# Patient Record
Sex: Male | Born: 1960 | Hispanic: Yes | Marital: Married | Smoking: Never smoker
Health system: Southern US, Community
[De-identification: ages and names within clinical notes are randomized; demographics above are authoritative.]

## PROBLEM LIST (undated history)

## (undated) HISTORY — PX: JOINT REPLACEMENT: SHX530

---

## 2011-01-24 ENCOUNTER — Emergency Department (HOSPITAL_COMMUNITY): Payer: Self-pay

## 2011-01-24 ENCOUNTER — Emergency Department (HOSPITAL_COMMUNITY)
Admission: EM | Admit: 2011-01-24 | Discharge: 2011-01-25 | Disposition: A | Discharge status: Home / Self Care | Payer: Self-pay | Attending: Emergency Medicine | Admitting: Emergency Medicine

## 2011-01-24 DIAGNOSIS — R109 Unspecified abdominal pain: Secondary | ICD-10-CM | POA: Insufficient documentation

## 2011-01-24 DIAGNOSIS — K7689 Other specified diseases of liver: Secondary | ICD-10-CM | POA: Insufficient documentation

## 2011-01-24 DIAGNOSIS — N201 Calculus of ureter: Secondary | ICD-10-CM | POA: Insufficient documentation

## 2011-01-24 LAB — URINE MICROSCOPIC-ADD ON

## 2011-01-24 LAB — URINALYSIS, ROUTINE W REFLEX MICROSCOPIC
Leukocytes, UA: NEGATIVE
Protein, ur: 30 mg/dL — AB
Specific Gravity, Urine: 1.019 (ref 1.005–1.030)
Urobilinogen, UA: 0.2 mg/dL (ref 0.0–1.0)

## 2014-08-24 ENCOUNTER — Inpatient Hospital Stay (HOSPITAL_COMMUNITY)
Admission: EM | Admit: 2014-08-24 | Discharge: 2014-08-27 | DRG: 603 | Disposition: A | Discharge status: Home / Self Care | Payer: BC Managed Care – PPO | Attending: Internal Medicine | Admitting: Internal Medicine

## 2014-08-24 ENCOUNTER — Ambulatory Visit (INDEPENDENT_AMBULATORY_CARE_PROVIDER_SITE_OTHER): Payer: BC Managed Care – PPO | Admitting: Family Medicine

## 2014-08-24 ENCOUNTER — Encounter (HOSPITAL_COMMUNITY): Payer: Self-pay | Admitting: Emergency Medicine

## 2014-08-24 ENCOUNTER — Emergency Department (HOSPITAL_COMMUNITY): Payer: BC Managed Care – PPO

## 2014-08-24 VITALS — BP 152/80 | HR 90 | Temp 99.0°F | Resp 18 | Ht 65.5 in | Wt 185.0 lb

## 2014-08-24 DIAGNOSIS — L02219 Cutaneous abscess of trunk, unspecified: Secondary | ICD-10-CM

## 2014-08-24 DIAGNOSIS — N498 Inflammatory disorders of other specified male genital organs: Secondary | ICD-10-CM | POA: Diagnosis not present

## 2014-08-24 DIAGNOSIS — L0291 Cutaneous abscess, unspecified: Secondary | ICD-10-CM

## 2014-08-24 DIAGNOSIS — Z23 Encounter for immunization: Secondary | ICD-10-CM

## 2014-08-24 DIAGNOSIS — N433 Hydrocele, unspecified: Secondary | ICD-10-CM | POA: Diagnosis present

## 2014-08-24 DIAGNOSIS — A4902 Methicillin resistant Staphylococcus aureus infection, unspecified site: Secondary | ICD-10-CM | POA: Diagnosis present

## 2014-08-24 DIAGNOSIS — L02214 Cutaneous abscess of groin: Secondary | ICD-10-CM | POA: Diagnosis present

## 2014-08-24 DIAGNOSIS — N492 Inflammatory disorders of scrotum: Secondary | ICD-10-CM | POA: Diagnosis present

## 2014-08-24 DIAGNOSIS — N453 Epididymo-orchitis: Secondary | ICD-10-CM | POA: Diagnosis present

## 2014-08-24 DIAGNOSIS — L03319 Cellulitis of trunk, unspecified: Secondary | ICD-10-CM

## 2014-08-24 DIAGNOSIS — I10 Essential (primary) hypertension: Secondary | ICD-10-CM

## 2014-08-24 DIAGNOSIS — B356 Tinea cruris: Secondary | ICD-10-CM | POA: Diagnosis present

## 2014-08-24 LAB — URINALYSIS, ROUTINE W REFLEX MICROSCOPIC
Glucose, UA: NEGATIVE mg/dL
Hgb urine dipstick: NEGATIVE
Ketones, ur: 40 mg/dL — AB
Leukocytes, UA: NEGATIVE
NITRITE: NEGATIVE
PROTEIN: 30 mg/dL — AB
SPECIFIC GRAVITY, URINE: 1.037 — AB (ref 1.005–1.030)
UROBILINOGEN UA: 4 mg/dL — AB (ref 0.0–1.0)
pH: 6 (ref 5.0–8.0)

## 2014-08-24 LAB — URINE MICROSCOPIC-ADD ON

## 2014-08-24 LAB — CBC
HCT: 40.3 % (ref 39.0–52.0)
HEMOGLOBIN: 13.2 g/dL (ref 13.0–17.0)
MCH: 28.3 pg (ref 26.0–34.0)
MCHC: 32.8 g/dL (ref 30.0–36.0)
MCV: 86.5 fL (ref 78.0–100.0)
Platelets: 196 10*3/uL (ref 150–400)
RBC: 4.66 MIL/uL (ref 4.22–5.81)
RDW: 14.3 % (ref 11.5–15.5)
WBC: 17.5 10*3/uL — AB (ref 4.0–10.5)

## 2014-08-24 LAB — CBG MONITORING, ED: GLUCOSE-CAPILLARY: 89 mg/dL (ref 70–99)

## 2014-08-24 NOTE — ED Provider Notes (Signed)
Complains of swelling of scrotum for approximately one week. He squeezed slight amount pus coming from scrotum earlier this week.. Pain is mild. He reports subjective fever 2 days ago which has resolved. On exam patient is alert nontoxic Glasgow Coma Score 15 abdomen nondistended nontender. Genitalia scrotum was grossly swollen. There is a fluctuant area approximately 3 cm in the posterior aspect of the scrotum. There is also fluctuant area approximately 10 cm x 5 cm at right groin  Doug Sou, MD 08/24/14 2136

## 2014-08-24 NOTE — ED Provider Notes (Signed)
CSN: [id number]     Arrival date & time 08/24/14  1859 History   First MD Initiated Contact with Patient 08/24/14 2045     Chief Complaint  Patient presents with  . Abscess     (Consider location/radiation/quality/duration/timing/severity/associated sxs/prior Treatment) HPI Jose Anthony is a(n) 53 y.o. male who presents to the ED with cc of rash and genital abscess. He was seen at the urgent care earlier today. He has had 1 month of rash of the buttocks and groin which has been self treating with peroxide and Neosporin. Over the past week he has noted swelling in the right groin and under the left testicle. Patient was able to express purulent discharge from the abscess under his left testicle. He subjective fever or yesterday but has not returned. He denies a history of diabetes. He has minimal pain.   History reviewed. No pertinent past medical history. Past Surgical History  Procedure Laterality Date  . Joint replacement     Family History  Problem Relation Age of Onset  . Cancer Mother   . Diabetes Mother   . Diabetes Father   . Diabetes Sister    History  Substance Use Topics  . Smoking status: Never Smoker   . Smokeless tobacco: Not on file  . Alcohol Use: No    Review of Systems  Ten systems reviewed and are negative for acute change, except as noted in the HPI.    Allergies  Review of patient's allergies indicates no known allergies.  Home Medications   Prior to Admission medications   Medication Sig Start Date End Date Taking? Authorizing Provider  acetaminophen (TYLENOL) 325 MG tablet Take 325-650 mg by mouth every 6 (six) hours as needed (pain).   Yes Historical Provider, MD  naproxen sodium (ANAPROX) 220 MG tablet Take 220 mg by mouth 2 (two) times daily as needed (pain).   Yes Historical Provider, MD   BP 163/79  Pulse 87  Temp(Src) 98.9 F (37.2 C) (Oral)  Resp 18  SpO2 99% Physical Exam  Nursing note and vitals reviewed. Constitutional: He  appears well-developed and well-nourished. No distress.  HENT:  Head: Normocephalic and atraumatic.  Eyes: Conjunctivae are normal. No scleral icterus.  Neck: Normal range of motion. Neck supple.  Cardiovascular: Normal rate, regular rhythm and normal heart sounds.   Pulmonary/Chest: Effort normal and breath sounds normal. No respiratory distress.  Abdominal: Soft. There is no tenderness.  Genitourinary: Penis normal.     Well demarcated area around the buttocks and groin with a serpiginous border, satellite lesions. Central clearing and fine scale. Appearance consistent with tinea cruris. There is a 10 cm x 6 cm area of induration with central fluctuants. It is visible area of purulence. The scrotum is indurated and edematous. There is and abscess of about 4 x 2 cm on the left perineum with purulent discharge. Normal genitalia without discharge. No abdominal tenderness.  Musculoskeletal: He exhibits no edema.  Neurological: He is alert.  Skin: Skin is warm and dry. He is not diaphoretic.  Psychiatric: His behavior is normal.    ED Course  Procedures (including critical care time) Labs Review Labs Reviewed  CBC  URINALYSIS, ROUTINE W REFLEX MICROSCOPIC  COMPREHENSIVE METABOLIC PANEL  CBG MONITORING, ED    Imaging Review No results found.   EKG Interpretation None      MDM   Final diagnoses:  Abscess  Essential hypertension    9:58 PM BP 163/79  Pulse 87  Temp(Src) 98.9 F (37.2  C) (Oral)  Resp 18  SpO2 99% Patient with impressive tinea infection and abscess/ He has minimal tenderness on exam I doubt Fournier's.  Patient seen in shared visit with attending physician.  12:27 AM BP 136/72  Pulse 89  Temp(Src) 98.9 F (37.2 C) (Oral)  Resp 18  SpO2 96% Patient labs returned  Urine with urobilinogen. No signs of infection Elevated WBC.I have discussed the findings with patient and he will be admitted by Dr. Elisabeth Pigeon. Dr. Abbey Chatters is aware of the patient  and will consult for surgery.he asks that he remain NPO>      Arthor Captain, PA-C 08/25/14 0045

## 2014-08-24 NOTE — Progress Notes (Signed)
[facility] [address] [place] [address] [phone]  Date:  08/24/2014   Name:  Jose Anthony   DOB:  [birth year]   MRN:  [id number]  PCP:  No PCP Per Patient    Chief Complaint: Rash and genital sores   History of Present Illness:  Jose Anthony is a 53 y.o. very pleasant male patient who presents with the following:  Here today with a rash.  He first noted this on his buttocks about a month ago- he thought it might be ringworm.  He has developed a few more areas on his legs and arms.  The rash is itchy. Over the last week or so he also noted involvement of his genitals.  Was just scaly but now has tender and swollen areas as well.   A few days ago he has testicular swelling, this went down but then returned yesterday.  He has also felt feverish.   He is married He is otherwise generally healthy He does not have a PCP- he is not on medication for his HTN.  He has had HTN for a couple of years and states that he is "supposed tobe on a diet" to treat his BP.    There are no active problems to display for this patient.   History reviewed. No pertinent past medical history.  Past Surgical History  Procedure Laterality Date  . Joint replacement      History  Substance Use Topics  . Smoking status: Never Smoker   . Smokeless tobacco: Not on file  . Alcohol Use: No    Family History  Problem Relation Age of Onset  . Cancer Mother   . Diabetes Mother   . Diabetes Father   . Diabetes Sister     No Known Allergies  Medication list has been reviewed and updated.  No current outpatient prescriptions on file prior to visit.   No current facility-administered medications on file prior to visit.    Review of Systems:  As per HPI- otherwise negative.   Physical Examination: Filed Vitals:   08/24/14 1725  BP: 178/86  Pulse: 90  Temp: 99 F (37.2 C)  Resp: 18   Filed Vitals:   08/24/14 1725  Height: 5' 5.5" (1.664 m)  Weight:  185 lb (83.915 kg)   Body mass index is 30.31 kg/(m^2). Ideal Body Weight: Weight in (lb) to have BMI = 25: 152.2  GEN: WDWN, NAD, Non-toxic, A & O x 3 HEENT: Atraumatic, Normocephalic. Neck supple. No masses, No LAD. Ears and Nose: No external deformity. CV: RRR, No M/G/R. No JVD. No thrill. No extra heart sounds. PULM: CTA B, no wheezes, crackles, rhonchi. No retractions. No resp. distress. No accessory muscle use. ABD: S, NT, NDM. EXTR: No c/c/e NEURO Normal gait.  PSYCH: Normally interactive. Conversant. Not depressed or anxious appearing.  Calm demeanor.  He has what appears to be widespread tinea over his buttock and groin. However he also has firm, indurated, fluctuant areas both in the right inguinal area and posterior to the scrotum.  There is edema of the scrotum and the testes are not easily palpated.    Assessment and Plan: Abscess of groin  Essential hypertension  Referral to ED for abscesses of the groin.  He may need admission for further treatment. He will drive to [place] now.  Asked him to follow-up with [place] in the next few weeks for his BP.    Signed Abbe Amsterdam, MD

## 2014-08-24 NOTE — Patient Instructions (Addendum)
Please proceed to the Emergency room at [facility] or [facility] hospital for further treatment. You can call your wife to meet you there. You may need to have a surgical procedure to drain the pus under your skin, and you may need to be admitted for IV antibiotics   Please come and see [place] in the next few weeks to talk more about your blood pressure

## 2014-08-24 NOTE — Progress Notes (Signed)
  CARE MANAGEMENT ED NOTE 08/24/2014  Patient:  Jose Anthony, Jose Anthony   Account Number:  [id number]  Date Initiated:  08/24/2014  Documentation initiated by:  Radford Pax  Subjective/Objective Assessment:   Patient presents to Ed with rash and genital abcesses     Subjective/Objective Assessment Detail:     Action/Plan:   Action/Plan Detail:   Anticipated DC Date:       Status Recommendation to Physician:   Result of Recommendation:    Other ED Services  Consult Working Plan    DC Planning Services  Other  PCP issues    Choice offered to / List presented to:            Status of service:  Completed, signed off  ED Comments:   ED Comments Detail:  EDCM spoke to patient at bedside.  Patient confrims he has [employer] without a pcp.  EDCM providedpatient with list of pcps who accept BCBS insurance within a ten mile radius of patient's zip code [address].  Discussed importance of finding a pcp.  Patient thankful for resources.  No further EDCM needs at this time.

## 2014-08-24 NOTE — ED Notes (Signed)
Pt reports that he had a rash appear behind his testicle and to his right thigh one month ago. Pt reports that he has areas of fluid collection in these areas now. Pt was seen at urgent care and was referred to the emergency room because the patient was instructed that these "may need to have a surgical procedure to drain the pus under your skin, and you may need to be admitted for IV antibiotics."

## 2014-08-25 ENCOUNTER — Encounter (HOSPITAL_COMMUNITY)
Admission: EM | Disposition: A | Discharge status: Home / Self Care | Payer: Self-pay | Source: Home / Self Care | Attending: Internal Medicine

## 2014-08-25 ENCOUNTER — Inpatient Hospital Stay (HOSPITAL_COMMUNITY): Payer: BC Managed Care – PPO | Admitting: Certified Registered Nurse Anesthetist

## 2014-08-25 ENCOUNTER — Inpatient Hospital Stay (HOSPITAL_COMMUNITY): Payer: BC Managed Care – PPO

## 2014-08-25 ENCOUNTER — Encounter (HOSPITAL_COMMUNITY): Payer: BC Managed Care – PPO | Admitting: Certified Registered Nurse Anesthetist

## 2014-08-25 DIAGNOSIS — A4902 Methicillin resistant Staphylococcus aureus infection, unspecified site: Secondary | ICD-10-CM | POA: Diagnosis present

## 2014-08-25 DIAGNOSIS — N433 Hydrocele, unspecified: Secondary | ICD-10-CM | POA: Diagnosis present

## 2014-08-25 DIAGNOSIS — B356 Tinea cruris: Secondary | ICD-10-CM | POA: Diagnosis present

## 2014-08-25 DIAGNOSIS — I10 Essential (primary) hypertension: Secondary | ICD-10-CM

## 2014-08-25 DIAGNOSIS — N498 Inflammatory disorders of other specified male genital organs: Secondary | ICD-10-CM | POA: Diagnosis present

## 2014-08-25 DIAGNOSIS — N453 Epididymo-orchitis: Secondary | ICD-10-CM | POA: Diagnosis present

## 2014-08-25 DIAGNOSIS — L0291 Cutaneous abscess, unspecified: Secondary | ICD-10-CM | POA: Insufficient documentation

## 2014-08-25 DIAGNOSIS — L02219 Cutaneous abscess of trunk, unspecified: Secondary | ICD-10-CM | POA: Diagnosis present

## 2014-08-25 DIAGNOSIS — L0390 Cellulitis, unspecified: Secondary | ICD-10-CM

## 2014-08-25 DIAGNOSIS — Z23 Encounter for immunization: Secondary | ICD-10-CM | POA: Diagnosis not present

## 2014-08-25 HISTORY — PX: INCISION AND DRAINAGE ABSCESS: SHX5864

## 2014-08-25 LAB — COMPREHENSIVE METABOLIC PANEL
ALBUMIN: 3.8 g/dL (ref 3.5–5.2)
ALBUMIN: 3.2 g/dL — AB (ref 3.5–5.2)
ALK PHOS: 96 U/L (ref 39–117)
ALT: 26 U/L (ref 0–53)
ALT: 29 U/L (ref 0–53)
ANION GAP: 17 — AB (ref 5–15)
AST: 20 U/L (ref 0–37)
AST: 26 U/L (ref 0–37)
Alkaline Phosphatase: 116 U/L (ref 39–117)
Anion gap: 12 (ref 5–15)
BILIRUBIN TOTAL: 0.7 mg/dL (ref 0.3–1.2)
BUN: 13 mg/dL (ref 6–23)
BUN: 11 mg/dL (ref 6–23)
CALCIUM: 9 mg/dL (ref 8.4–10.5)
CHLORIDE: 102 meq/L (ref 96–112)
CO2: 22 mEq/L (ref 19–32)
CO2: 25 mEq/L (ref 19–32)
CREATININE: 0.77 mg/dL (ref 0.50–1.35)
Calcium: 8.4 mg/dL (ref 8.4–10.5)
Chloride: 98 mEq/L (ref 96–112)
Creatinine, Ser: 0.69 mg/dL (ref 0.50–1.35)
GFR calc Af Amer: >90 mL/min (ref >90)
GFR calc Af Amer: >90 mL/min (ref >90)
GFR calc non Af Amer: >90 mL/min (ref >90)
GFR calc non Af Amer: >90 mL/min (ref >90)
Glucose, Bld: 93 mg/dL (ref 70–99)
Glucose, Bld: 135 mg/dL — ABNORMAL HIGH (ref 70–99)
POTASSIUM: 3.8 meq/L (ref 3.7–5.3)
POTASSIUM: 3.8 meq/L (ref 3.7–5.3)
SODIUM: 137 meq/L (ref 137–147)
Sodium: 139 mEq/L (ref 137–147)
TOTAL PROTEIN: 7.6 g/dL (ref 6.0–8.3)
Total Bilirubin: 0.8 mg/dL (ref 0.3–1.2)
Total Protein: 6.8 g/dL (ref 6.0–8.3)

## 2014-08-25 LAB — SURGICAL PCR SCREEN
MRSA, PCR: POSITIVE — AB
STAPHYLOCOCCUS AUREUS: POSITIVE — AB

## 2014-08-25 LAB — CBC
HEMATOCRIT: 37.4 % — AB (ref 39.0–52.0)
Hemoglobin: 12.4 g/dL — ABNORMAL LOW (ref 13.0–17.0)
MCH: 28.3 pg (ref 26.0–34.0)
MCHC: 33.2 g/dL (ref 30.0–36.0)
MCV: 85.4 fL (ref 78.0–100.0)
Platelets: 205 10*3/uL (ref 150–400)
RBC: 4.38 MIL/uL (ref 4.22–5.81)
RDW: 14.4 % (ref 11.5–15.5)
WBC: 14.8 10*3/uL — AB (ref 4.0–10.5)

## 2014-08-25 LAB — GLUCOSE, CAPILLARY: Glucose-Capillary: 132 mg/dL — ABNORMAL HIGH (ref 70–99)

## 2014-08-25 SURGERY — INCISION AND DRAINAGE, ABSCESS
Anesthesia: General | Site: Groin

## 2014-08-25 MED ORDER — ONDANSETRON HCL 4 MG/2ML IJ SOLN
INTRAMUSCULAR | Status: AC
  Filled 2014-08-25: qty 2

## 2014-08-25 MED ORDER — FENTANYL CITRATE 0.05 MG/ML IJ SOLN
INTRAMUSCULAR | Status: AC
  Filled 2014-08-25: qty 5

## 2014-08-25 MED ORDER — IOHEXOL 300 MG/ML  SOLN
100.0000 mL | Freq: Once | INTRAMUSCULAR | Status: AC | PRN
  Administered 2014-08-25: 100 mL via INTRAVENOUS

## 2014-08-25 MED ORDER — SODIUM CHLORIDE 0.9 % IV SOLN
INTRAVENOUS | Status: AC
  Administered 2014-08-25 (×2): via INTRAVENOUS

## 2014-08-25 MED ORDER — OXYCODONE HCL 5 MG PO TABS
5.0000 mg | ORAL_TABLET | ORAL | Status: DC | PRN
  Administered 2014-08-25 – 2014-08-27 (×2): 10 mg via ORAL
  Filled 2014-08-25 (×2): qty 2

## 2014-08-25 MED ORDER — CHLORHEXIDINE GLUCONATE CLOTH 2 % EX PADS
6.0000 | MEDICATED_PAD | Freq: Every day | CUTANEOUS | Status: DC
  Administered 2014-08-26: 6 via TOPICAL

## 2014-08-25 MED ORDER — ONDANSETRON HCL 4 MG PO TABS: 4.0000 mg | ORAL_TABLET | Freq: Four times a day (QID) | ORAL | Status: DC | PRN

## 2014-08-25 MED ORDER — HYDROCODONE-ACETAMINOPHEN 5-325 MG PO TABS: 1.0000 | ORAL_TABLET | ORAL | Status: DC | PRN

## 2014-08-25 MED ORDER — MIDAZOLAM HCL 2 MG/2ML IJ SOLN
INTRAMUSCULAR | Status: AC
  Filled 2014-08-25: qty 2

## 2014-08-25 MED ORDER — ALBUTEROL SULFATE HFA 108 (90 BASE) MCG/ACT IN AERS
INHALATION_SPRAY | RESPIRATORY_TRACT | Status: AC
  Filled 2014-08-25: qty 6.7

## 2014-08-25 MED ORDER — ONDANSETRON HCL 4 MG/2ML IJ SOLN: 4.0000 mg | Freq: Four times a day (QID) | INTRAMUSCULAR | Status: DC | PRN

## 2014-08-25 MED ORDER — 0.9 % SODIUM CHLORIDE (POUR BTL) OPTIME
TOPICAL | Status: DC | PRN
  Administered 2014-08-25: 1000 mL

## 2014-08-25 MED ORDER — ACETAMINOPHEN 325 MG PO TABS: 650.0000 mg | ORAL_TABLET | Freq: Four times a day (QID) | ORAL | Status: DC | PRN

## 2014-08-25 MED ORDER — ONDANSETRON HCL 4 MG/2ML IJ SOLN
INTRAMUSCULAR | Status: DC | PRN
  Administered 2014-08-25: 4 mg via INTRAVENOUS

## 2014-08-25 MED ORDER — PROPOFOL 10 MG/ML IV BOLUS
INTRAVENOUS | Status: AC
  Filled 2014-08-25: qty 20

## 2014-08-25 MED ORDER — ACETAMINOPHEN 650 MG RE SUPP: 650.0000 mg | Freq: Four times a day (QID) | RECTAL | Status: DC | PRN

## 2014-08-25 MED ORDER — MORPHINE SULFATE 2 MG/ML IJ SOLN: 1.0000 mg | INTRAMUSCULAR | Status: DC | PRN

## 2014-08-25 MED ORDER — MUPIROCIN 2 % EX OINT
1.0000 "application " | TOPICAL_OINTMENT | Freq: Two times a day (BID) | CUTANEOUS | Status: DC
  Administered 2014-08-25 – 2014-08-27 (×5): 1 via NASAL
  Filled 2014-08-25: qty 22

## 2014-08-25 MED ORDER — MEPERIDINE HCL 50 MG/ML IJ SOLN: 6.2500 mg | INTRAMUSCULAR | Status: DC | PRN

## 2014-08-25 MED ORDER — FENTANYL CITRATE 0.05 MG/ML IJ SOLN
INTRAMUSCULAR | Status: AC
  Filled 2014-08-25: qty 2

## 2014-08-25 MED ORDER — PIPERACILLIN-TAZOBACTAM 3.375 G IVPB
3.3750 g | Freq: Three times a day (TID) | INTRAVENOUS | Status: DC
  Administered 2014-08-25 – 2014-08-26 (×6): 3.375 g via INTRAVENOUS
  Filled 2014-08-25 (×7): qty 50

## 2014-08-25 MED ORDER — LIDOCAINE HCL (CARDIAC) 20 MG/ML IV SOLN
INTRAVENOUS | Status: DC | PRN
  Administered 2014-08-25: 80 mg via INTRAVENOUS

## 2014-08-25 MED ORDER — CLINDAMYCIN PHOSPHATE 600 MG/50ML IV SOLN
600.0000 mg | Freq: Once | INTRAVENOUS | Status: DC
  Administered 2014-08-25: 600 mg via INTRAVENOUS
  Filled 2014-08-25: qty 50

## 2014-08-25 MED ORDER — ALBUTEROL SULFATE HFA 108 (90 BASE) MCG/ACT IN AERS
INHALATION_SPRAY | RESPIRATORY_TRACT | Status: DC | PRN
  Administered 2014-08-25: 4 via RESPIRATORY_TRACT

## 2014-08-25 MED ORDER — MIDAZOLAM HCL 5 MG/5ML IJ SOLN
INTRAMUSCULAR | Status: DC | PRN
  Administered 2014-08-25: 2 mg via INTRAVENOUS

## 2014-08-25 MED ORDER — SUCCINYLCHOLINE CHLORIDE 20 MG/ML IJ SOLN
INTRAMUSCULAR | Status: DC | PRN
  Administered 2014-08-25: 100 mg via INTRAVENOUS

## 2014-08-25 MED ORDER — LACTATED RINGERS IV SOLN
INTRAVENOUS | Status: DC | PRN
  Administered 2014-08-25: 10:00:00 via INTRAVENOUS

## 2014-08-25 MED ORDER — FENTANYL CITRATE 0.05 MG/ML IJ SOLN
25.0000 ug | INTRAMUSCULAR | Status: DC | PRN
  Administered 2014-08-25 (×2): 50 ug via INTRAVENOUS

## 2014-08-25 MED ORDER — PROPOFOL 10 MG/ML IV BOLUS
INTRAVENOUS | Status: DC | PRN
  Administered 2014-08-25: 90 mg via INTRAVENOUS
  Administered 2014-08-25: 200 mg via INTRAVENOUS

## 2014-08-25 MED ORDER — INFLUENZA VAC SPLIT QUAD 0.5 ML IM SUSY
0.5000 mL | PREFILLED_SYRINGE | INTRAMUSCULAR | Status: AC
  Administered 2014-08-26: 0.5 mL via INTRAMUSCULAR
  Filled 2014-08-25 (×2): qty 0.5

## 2014-08-25 MED ORDER — VANCOMYCIN HCL 10 G IV SOLR
1500.0000 mg | Freq: Two times a day (BID) | INTRAVENOUS | Status: DC
  Administered 2014-08-25 – 2014-08-26 (×3): 1500 mg via INTRAVENOUS
  Filled 2014-08-25 (×4): qty 1500

## 2014-08-25 MED ORDER — MORPHINE SULFATE 2 MG/ML IJ SOLN: 2.0000 mg | INTRAMUSCULAR | Status: DC | PRN

## 2014-08-25 MED ORDER — PIPERACILLIN-TAZOBACTAM 3.375 G IVPB 30 MIN
3.3750 g | INTRAVENOUS | Status: AC
  Administered 2014-08-25: 3.375 g via INTRAVENOUS
  Filled 2014-08-25: qty 50

## 2014-08-25 MED ORDER — FENTANYL CITRATE 0.05 MG/ML IJ SOLN
INTRAMUSCULAR | Status: DC | PRN
  Administered 2014-08-25 (×3): 50 ug via INTRAVENOUS

## 2014-08-25 MED ORDER — LACTATED RINGERS IV SOLN: INTRAVENOUS | Status: DC

## 2014-08-25 MED ORDER — LIDOCAINE HCL (CARDIAC) 20 MG/ML IV SOLN
INTRAVENOUS | Status: AC
  Filled 2014-08-25: qty 5

## 2014-08-25 MED ORDER — PROMETHAZINE HCL 25 MG/ML IJ SOLN: 6.2500 mg | INTRAMUSCULAR | Status: DC | PRN

## 2014-08-25 SURGICAL SUPPLY — 12 items
BNDG CONFORM 2 STRL LF (GAUZE/BANDAGES/DRESSINGS) ×3 IMPLANT
DRAIN PENROSE 18X1/2 LTX STRL (DRAIN) ×3 IMPLANT
GOWN STRL REUS W/ TWL XL LVL3 (GOWN DISPOSABLE) ×3 IMPLANT
GOWN STRL REUS W/TWL XL LVL3 (GOWN DISPOSABLE) ×6
PACK GENERAL/GYN (CUSTOM PROCEDURE TRAY) ×3 IMPLANT
PAD ABD 8X10 STRL (GAUZE/BANDAGES/DRESSINGS) ×3 IMPLANT
SCOTCHCAST PLUS 4X4 WHITE (CAST SUPPLIES) ×3 IMPLANT
SHEET LAVH (DRAPES) ×3 IMPLANT
SWAB COLLECTION DEVICE MRSA (MISCELLANEOUS) ×6 IMPLANT
TOWEL OR 17X26 10 PK STRL BLUE (TOWEL DISPOSABLE) ×3 IMPLANT
TOWEL OR NON WOVEN STRL DISP B (DISPOSABLE) ×3 IMPLANT
TUBE ANAEROBIC SPECIMEN COL (MISCELLANEOUS) ×6 IMPLANT

## 2014-08-25 NOTE — Transfer of Care (Signed)
Immediate Anesthesia Transfer of Care Note  Patient: Jose Anthony  Procedure(s) Performed: Procedure(s) (LRB): INCISION AND DRAINAGE ABSCESS (N/A)  Patient Location: PACU  Anesthesia Type: General  Level of Consciousness: sedated, patient cooperative and responds to stimulation  Airway & Oxygen Therapy: Patient Spontanous Breathing and Patient connected to face mask oxgen  Post-op Assessment: Report given to PACU RN and Post -op Vital signs reviewed and stable  Post vital signs: Reviewed and stable  Complications: No apparent anesthesia complications

## 2014-08-25 NOTE — Anesthesia Preprocedure Evaluation (Addendum)
Anesthesia Evaluation  Patient identified by MRN, date of birth, ID band Patient awake    Reviewed: Allergy & Precautions, H&P , NPO status , Patient's Chart, lab work & pertinent test results  History of Anesthesia Complications Negative for: history of anesthetic complications  Airway Mallampati: III TM Distance: >3 FB Neck ROM: Full    Dental no notable dental hx.    Pulmonary neg pulmonary ROS,  breath sounds clear to auscultation  Pulmonary exam normal       Cardiovascular hypertension, Rhythm:Regular Rate:Normal     Neuro/Psych negative neurological ROS  negative psych ROS   GI/Hepatic negative GI ROS, Neg liver ROS,   Endo/Other  negative endocrine ROS  Renal/GU negative Renal ROS   Scrotal abcess    Musculoskeletal negative musculoskeletal ROS (+)   Abdominal   Peds negative pediatric ROS (+)  Hematology negative hematology ROS (+)   Anesthesia Other Findings   Reproductive/Obstetrics negative OB ROS                          Anesthesia Physical Anesthesia Plan  ASA: II and emergent  Anesthesia Plan: General   Post-op Pain Management:    Induction: Intravenous  Airway Management Planned: LMA and Oral ETT  Additional Equipment:   Intra-op Plan:   Post-operative Plan: Extubation in OR  Informed Consent: I have reviewed the patients History and Physical, chart, labs and discussed the procedure including the risks, benefits and alternatives for the proposed anesthesia with the patient or authorized representative who has indicated his/her understanding and acceptance.   Dental advisory given  Plan Discussed with: CRNA  Anesthesia Plan Comments:         Anesthesia Quick Evaluation

## 2014-08-25 NOTE — ED Provider Notes (Signed)
Medical screening examination/treatment/procedure(s) were conducted as a shared visit with non-physician practitioner(s) and myself.  I personally evaluated the patient during the encounter.   EKG Interpretation None       Doug Sou, MD 08/25/14 1610

## 2014-08-25 NOTE — Progress Notes (Signed)
ANTIBIOTIC CONSULT NOTE - INITIAL  Pharmacy Consult for Vancomycin & Zosyn Indication: Wound Infection/Cellulitis  No Known Allergies  Patient Measurements: Height: 5' 5.5" (166.4 cm) Weight: 185 lb (83.915 kg) IBW/kg (Calculated) : 62.65  Vital Signs: Temp: 98.9 F (37.2 C) (09/25 2351) Temp src: Oral (09/25 2351) BP: 136/72 mmHg (09/25 2351) Pulse Rate: 89 (09/25 2351) Intake/Output from previous day:   Intake/Output from this shift:    Labs:  Recent Labs  08/24/14 2132 08/24/14 2133  WBC 17.5*  --   HGB 13.2  --   PLT 196  --   CREATININE  --  0.69   Estimated Creatinine Clearance: 107.5 ml/min (by C-G formula based on Cr of 0.69). No results found for this basename: VANCOTROUGH, VANCOPEAK, VANCORANDOM, GENTTROUGH, GENTPEAK, GENTRANDOM, TOBRATROUGH, TOBRAPEAK, TOBRARND, AMIKACINPEAK, AMIKACINTROU, AMIKACIN,  in the last 72 hours   Microbiology: No results found for this or any previous visit (from the past 720 hour(s)).  Medical History: History reviewed. No pertinent past medical history.  Medications:  Scheduled:   Infusions:  . piperacillin-tazobactam     Assessment:  53 yr male with complaint of scrotum swelling with abscess  Clindamycin  IV x 1 given in ED @ 00:19  Surgery consulted  Pharmacy asked to dose Vancomycin and Zosyn for wound infection/cellulitis  Goal of Therapy:  Vancomycin trough level 10-15 mcg/ml  Plan:  Measure antibiotic drug levels at steady state Follow up culture results  Zosyn 3.375gm IV q8h (each dose infused over 4 hrs)  Vancomycin  IV q12h  Kiondra Caicedo, Joselyn Glassman, PharmD 08/25/2014,12:47 AM

## 2014-08-25 NOTE — Progress Notes (Signed)
[facility] PROGRESS NOTE  Jose Anthony ONG:[id number] DOB: Jan 13, 1961 DOA: 08/24/2014 PCP: No PCP Per Patient  Assessment/Plan: 1. Growing and scrotal abscess -Patient presenting with complaints of increasing pain, swelling and purulent discharge since last Tuesday. -Initial evaluation included a CT scan which showed extensive scrotal edema and skin thickening with infiltration in the scrotal and right groin subcutaneous fat consistent with cellulitis. Small collections demonstrated in the right medial thigh and left posterior inferior scrotal skin consistent with small abscesses. -General surgery and urology consulted, patient taken to the OR on 08/25/2014 where he underwent incision and drainage. -Followup on cultures -Meanwhile will continue empiric IV antibiotic therapy with Zosyn and Vancomycin  Code Status: Full code Family Communication: Discussed case with patient's wife who was present at bedside Disposition Plan: Continue broad-spectrum IV antibiotic therapy, supportive care, followup on cultures   Consultants:  General surgery  Urology  Procedures:  Incision and drainage of right groin and scrotal abscess  Antibiotics:  Vancomycin IV started on 08/25/2014  Zosyn IV started on 08/25/2014  HPI/Subjective: Patient is a pleasant 53 year old with no significant past medical history who was admitted to the medicine service on 08/25/2014. Patient presented with worsening erythema, pain and swelling involving the right groin area. Stated this started last Tuesday as a boil. He noticed purulent drainage as well as the development of fevers and chills one day prior to his hospitalization. In the emergency room he was found to have abscess in the groin and scrotum, general surgery and urology were consulted. He was taken to the OR on 08/25/2014 undergoing incision and drainage.  Objective: Filed Vitals:   08/25/14 1400  BP: 125/67  Pulse: 79  Temp: 97.8 F (36.6 C)   Resp: 16    Intake/Output Summary (Last 24 hours) at 08/25/14 1557 Last data filed at 08/25/14 1122  Gross per 24 hour  Intake 1306.25 ml  Output      0 ml  Net 1306.25 ml   Filed Weights   08/25/14 0045 08/25/14 0104 08/25/14 0552  Weight: 83.915 kg (185 lb) 84.777 kg (186 lb 14.4 oz) 84.7 kg (186 lb 11.7 oz)    Exam:   General:  Patient states doing well, he is awake alert oriented, nontoxic appearing  Cardiovascular: Regular rate and rhythm normal S1-S2  Respiratory: Overall clear to auscultation bilaterally, normal respiratory effort  Abdomen: Soft nontender nondistended  Musculoskeletal: Status post incision and drainage to right growing and scrotal abscess, surgical incision sites appear clean there is no evidence of purulent drainage or active bleeding  Data Reviewed: Basic Metabolic Panel:  Recent Labs Lab 08/24/14 2133 08/25/14 0536  NA 137 139  K 3.8 3.8  CL 98 102  CO2 22 25  GLUCOSE 93 135*  BUN 13 11  CREATININE 0.69 0.77  CALCIUM 9.0 8.4   Liver Function Tests:  Recent Labs Lab 08/24/14 2133 08/25/14 0536  AST 20 26  ALT 26 29  ALKPHOS 96 116  BILITOT 0.8 0.7  PROT 7.6 6.8  ALBUMIN 3.8 3.2*   No results found for this basename: LIPASE, AMYLASE,  in the last 168 hours No results found for this basename: AMMONIA,  in the last 168 hours CBC:  Recent Labs Lab 08/24/14 2132 08/25/14 0536  WBC 17.5* 14.8*  HGB 13.2 12.4*  HCT 40.3 37.4*  MCV 86.5 85.4  PLT 196 205   Cardiac Enzymes: No results found for this basename: CKTOTAL, CKMB, CKMBINDEX, TROPONINI,  in the last 168 hours BNP (last 3  results) No results found for this basename: PROBNP,  in the last 8760 hours CBG:  Recent Labs Lab 08/24/14 2112 08/25/14 0732  GLUCAP 89 132*    Recent Results (from the past 240 hour(s))  SURGICAL PCR SCREEN     Status: Abnormal   Collection Time    08/25/14  9:04 AM      Result Value Ref Range Status   MRSA, PCR POSITIVE (*)  NEGATIVE Final   Comment: RESULT CALLED TO, READ BACK BY AND VERIFIED WITH:     C.RUSSELL AT 1238 ON [id number] BY C.BONGEL   Staphylococcus aureus POSITIVE (*) NEGATIVE Final   Comment:            The Xpert SA Assay (FDA     approved for NASAL specimens     in patients over 60 years of age),     is one component of     a comprehensive surveillance     program.  Test performance has     been validated by [employer] for patients greater     than or equal to 98 year old.     It is not intended     to diagnose infection nor to     guide or monitor treatment.     Studies: US Scrotum  08/24/2014   CLINICAL DATA:  Pain, swelling, rash  EXAM: SCROTAL ULTRASOUND  DOPPLER ULTRASOUND OF THE TESTICLES  TECHNIQUE: Complete ultrasound examination of the testicles, epididymis, and other scrotal structures was performed. Color and spectral Doppler ultrasound were also utilized to evaluate blood flow to the testicles.  COMPARISON:  None.  FINDINGS: Right testicle  Measurements: 5.1 x 3 x 3.1 cm. No mass or microlithiasis visualized.  Left testicle  Measurements: 4.2 x 2.6 x 3.3 cm. No mass or microlithiasis visualized.  Right epididymis:  Normal in size.  Small spermatocele.  Left epididymis: Enlarged left epididymis with a heterogeneous appearance and increased Doppler flow relative to the right. 2.1 cm left spermatocele.  Hydrocele:  Bilateral hydroceles, right greater than left.  Varicocele:  None visualized.  Pulsed Doppler interrogation of both testes demonstrates low resistance arterial and venous waveforms bilaterally.  Other: Scrotal wall thickening. Soft tissue edema involving the perineum.  IMPRESSION: 1. Enlarged heterogeneous left epididymis with increased Doppler flow concerning for epididymitis. 2. Scrotal wall thickening and soft tissue edema extending into the perineum. 3. Bilateral hydroceles, right greater than left.   Electronically Signed   By: Elige Ko   On: 08/24/2014 23:22   Ct  Abdomen Pelvis W Contrast  08/25/2014   CLINICAL DATA:  Genital and right thigh pain and swelling. Rash. Evaluate for abscess.  EXAM: CT ABDOMEN AND PELVIS WITH CONTRAST  TECHNIQUE: Multidetector CT imaging of the abdomen and pelvis was performed using the standard protocol following bolus administration of intravenous contrast.  CONTRAST:  OMNIPAQUE IOHEXOL 300 MG/ML  SOLN  COMPARISON:  Ultrasound testes 08/24/2014. CT abdomen and pelvis 01/24/2011.  FINDINGS: Lung bases are clear.  The liver, spleen, gallbladder, pancreas, adrenal glands, abdominal aorta, inferior vena cava, and retroperitoneal lymph nodes are unremarkable. Small low-attenuation lesion in the lower pole right kidney measuring 2.3 cm diameter consistent with cyst. No hydronephrosis in either kidney. Stomach and small bowel are not abnormally distended. Stool in the colon without distention or wall thickening. No free air or free fluid in the abdomen.  Pelvis: Appendix is normal. Prostate gland is not enlarged. Bladder wall is not thickened.  No free or loculated pelvic fluid collections. No pelvic lymphadenopathy. Left inguinal hernia containing fat. Moderately prominent groin lymph nodes bilaterally are likely reactive. There is evidence of edema and skin thickening of the scrotum and right groin region consistent with cellulitis. There is a small soft tissue fluid collection in the right medial upper thigh measuring about 9 x 20 mm consistent with a small abscess. Suggestion of small loculated collection in the posterior inferior aspect of the scrotum towards the left and measuring 1.7 cm. Visualization is limited due to significant scrotal edema. Small hydroceles. Degenerative changes in the lumbar spine. No destructive bone lesions appreciated.  IMPRESSION: Extensive scrotal edema and skin thickening with infiltration in the scrotal and right groin subcutaneous fat consistent with cellulitis. Small collections are demonstrated in the  right medial thigh and left posterior inferior scrotal skin consistent with small abscesses.   Electronically Signed   By: Burman Nieves M.D.   On: 08/25/2014 03:33   [place] Art/ven Flow Abd Pelv Doppler  08/24/2014   CLINICAL DATA:  Pain, swelling, rash  EXAM: SCROTAL ULTRASOUND  DOPPLER ULTRASOUND OF THE TESTICLES  TECHNIQUE: Complete ultrasound examination of the testicles, epididymis, and other scrotal structures was performed. Color and spectral Doppler ultrasound were also utilized to evaluate blood flow to the testicles.  COMPARISON:  None.  FINDINGS: Right testicle  Measurements: 5.1 x 3 x 3.1 cm. No mass or microlithiasis visualized.  Left testicle  Measurements: 4.2 x 2.6 x 3.3 cm. No mass or microlithiasis visualized.  Right epididymis:  Normal in size.  Small spermatocele.  Left epididymis: Enlarged left epididymis with a heterogeneous appearance and increased Doppler flow relative to the right. 2.1 cm left spermatocele.  Hydrocele:  Bilateral hydroceles, right greater than left.  Varicocele:  None visualized.  Pulsed Doppler interrogation of both testes demonstrates low resistance arterial and venous waveforms bilaterally.  Other: Scrotal wall thickening. Soft tissue edema involving the perineum.  IMPRESSION: 1. Enlarged heterogeneous left epididymis with increased Doppler flow concerning for epididymitis. 2. Scrotal wall thickening and soft tissue edema extending into the perineum. 3. Bilateral hydroceles, right greater than left.   Electronically Signed   By: Elige Ko   On: 08/24/2014 23:22    Scheduled Meds: . Melene Muller ON 08/26/2014] Chlorhexidine Gluconate Cloth  6 each Topical Q0600  . fentaNYL      . [START ON 08/26/2014] Influenza vac split quadrivalent PF  0.5 mL Intramuscular Tomorrow-1000  . mupirocin ointment  1 application Nasal BID  . piperacillin-tazobactam (ZOSYN)  IV  3.375 g Intravenous Q8H  . vancomycin  1,500 mg Intravenous Q12H   Continuous Infusions:   Principal  Problem:   Abscess    Time spent:    Jeralyn Bennett  [facility] Pager [phone]. If 7PM-7AM, please contact night-coverage at [email or web address], password [facility] 08/25/2014, 3:57 PM  LOS: 1 day

## 2014-08-25 NOTE — Consult Note (Signed)
Reason for Consult:  Right groin abscess Referring Physician:  Dr. Manson Passey  Jose Anthony is an 53 y.o. male.  HPI: He had the onset of a "rash" in the right groin and left scrotal area 4-5 days ago.  He began scratching the areas and then noted them to become red, swollen and painful.  The also began draining.  He presented to the ED last night.   CT demonstrated a 2 cm fluid collection in the right groin with surrounding inflammatory changes and thickening of the left scrotal area.  US of the scrotum demonstrated a enlarged epididymis on the left along with scrotal swelling.  He was admitted by Dr. Elisabeth Pigeon and placed on IV abxs.  I was asked to see him because of the right groin abscess.  History reviewed. No pertinent past medical history.  Past Surgical History  Procedure Laterality Date  . Joint replacement      Family History  Problem Relation Age of Onset  . Cancer Mother   . Diabetes Mother   . Diabetes Father   . Diabetes Sister     Social History:  reports that he has never smoked. He does not have any smokeless tobacco history on file. He reports that he does not drink alcohol or use illicit drugs.  Allergies: No Known Allergies  Current facility-administered medications:0.9 %  sodium chloride infusion, , Intravenous, Continuous, Alison Murray, MD, Last Rate: 75 mL/hr at 08/25/14 0135;  acetaminophen (TYLENOL) suppository 650 mg, 650 mg, Rectal, Q6H PRN, Alison Murray, MD;  acetaminophen (TYLENOL) tablet 650 mg, 650 mg, Oral, Q6H PRN, Alison Murray, MD;  HYDROcodone-acetaminophen (NORCO/VICODIN) 5-325 MG per tablet 1-2 tablet, 1-2 tablet, Oral, Q4H PRN, Alison Murray, MD [START ON 08/26/2014] Influenza vac split quadrivalent PF (FLUARIX) injection 0.5 mL, 0.5 mL, Intramuscular, Tomorrow-1000, Alison Murray, MD;  morphine 2 MG/ML injection 1 mg, 1 mg, Intravenous, Q4H PRN, Alison Murray, MD;  ondansetron [facility]) injection 4 mg, 4 mg, Intravenous, Q6H PRN, Alison Murray, MD;   ondansetron [facility]) tablet 4 mg, 4 mg, Oral, Q6H PRN, Alison Murray, MD piperacillin-tazobactam (ZOSYN) IVPB 3.375 g, 3.375 g, Intravenous, Q8H, Leann Trefz Poindexter, RPH, 3.375 g at 08/25/14 0800;  vancomycin (VANCOCIN) 1,500 mg in sodium chloride 0.9 % 500 mL IVPB, 1,500 mg, Intravenous, Q12H, Leann Trefz Poindexter, RPH, 1,500 mg at 08/25/14 0100 }  Results for orders placed during the hospital encounter of 08/24/14 (from the past 48 hour(s))  CBG MONITORING, ED     Status: None   Collection Time    08/24/14  9:12 PM      Result Value Ref Range   Glucose-Capillary 89  70 - 99 mg/dL   Comment 1 Notify RN    CBC     Status: Abnormal   Collection Time    08/24/14  9:32 PM      Result Value Ref Range   WBC 17.5 (*) 4.0 - 10.5 K/uL   RBC 4.66  4.22 - 5.81 MIL/uL   Hemoglobin 13.2  13.0 - 17.0 g/dL   HCT 66.3  55.6 - 34.6 %   MCV 86.5  78.0 - 100.0 fL   MCH 28.3  26.0 - 34.0 pg   MCHC 32.8  30.0 - 36.0 g/dL   RDW 95.8  48.7 - 42.5 %   Platelets 196  150 - 400 K/uL  COMPREHENSIVE METABOLIC PANEL     Status: Abnormal   Collection Time    08/24/14  9:33 PM      Result Value Ref Range   Sodium 137  137 - 147 mEq/L   Potassium 3.8  3.7 - 5.3 mEq/L   Chloride 98  96 - 112 mEq/L   CO2 22  19 - 32 mEq/L   Glucose, Bld 93  70 - 99 mg/dL   BUN 13  6 - 23 mg/dL   Creatinine, Ser 0.69  0.50 - 1.35 mg/dL   Calcium 9.0  8.4 - 10.5 mg/dL   Total Protein 7.6  6.0 - 8.3 g/dL   Albumin 3.8  3.5 - 5.2 g/dL   AST 20  0 - 37 U/L   ALT 26  0 - 53 U/L   Alkaline Phosphatase 96  39 - 117 U/L   Total Bilirubin 0.8  0.3 - 1.2 mg/dL   GFR calc non Af Amer >90  >90 mL/min   GFR calc Af Amer >90  >90 mL/min   Comment: (NOTE)     The eGFR has been calculated using the CKD EPI equation.     This calculation has not been validated in all clinical situations.     eGFR's persistently <90 mL/min signify possible Chronic Kidney     Disease.   Anion gap 17 (*) 5 - 15  URINALYSIS, ROUTINE W REFLEX  MICROSCOPIC     Status: Abnormal   Collection Time    08/24/14 10:16 PM      Result Value Ref Range   Color, Urine AMBER (*) YELLOW   Comment: BIOCHEMICALS MAY BE AFFECTED BY COLOR   APPearance CLEAR  CLEAR   Specific Gravity, Urine 1.037 (*) 1.005 - 1.030   pH 6.0  5.0 - 8.0   Glucose, UA NEGATIVE  NEGATIVE mg/dL   Hgb urine dipstick NEGATIVE  NEGATIVE   Bilirubin Urine SMALL (*) NEGATIVE   Ketones, ur 40 (*) NEGATIVE mg/dL   Protein, ur 30 (*) NEGATIVE mg/dL   Urobilinogen, UA 4.0 (*) 0.0 - 1.0 mg/dL   Nitrite NEGATIVE  NEGATIVE   Leukocytes, UA NEGATIVE  NEGATIVE  URINE MICROSCOPIC-ADD ON     Status: Abnormal   Collection Time    08/24/14 10:16 PM      Result Value Ref Range   Squamous Epithelial / LPF RARE  RARE   WBC, UA 0-2  <3 WBC/hpf   Bacteria, UA FEW (*) RARE   Urine-Other MUCOUS PRESENT    COMPREHENSIVE METABOLIC PANEL     Status: Abnormal   Collection Time    08/25/14  5:36 AM      Result Value Ref Range   Sodium 139  137 - 147 mEq/L   Potassium 3.8  3.7 - 5.3 mEq/L   Chloride 102  96 - 112 mEq/L   CO2 25  19 - 32 mEq/L   Glucose, Bld 135 (*) 70 - 99 mg/dL   BUN 11  6 - 23 mg/dL   Creatinine, Ser 0.77  0.50 - 1.35 mg/dL   Calcium 8.4  8.4 - 10.5 mg/dL   Total Protein 6.8  6.0 - 8.3 g/dL   Albumin 3.2 (*) 3.5 - 5.2 g/dL   AST 26  0 - 37 U/L   ALT 29  0 - 53 U/L   Alkaline Phosphatase 116  39 - 117 U/L   Total Bilirubin 0.7  0.3 - 1.2 mg/dL   GFR calc non Af Amer >90  >90 mL/min   GFR calc Af Amer >90  >90 mL/min   Comment: (  NOTE)     The eGFR has been calculated using the CKD EPI equation.     This calculation has not been validated in all clinical situations.     eGFR's persistently <90 mL/min signify possible Chronic Kidney     Disease.   Anion gap 12  5 - 15  CBC     Status: Abnormal   Collection Time    08/25/14  5:36 AM      Result Value Ref Range   WBC 14.8 (*) 4.0 - 10.5 K/uL   RBC 4.38  4.22 - 5.81 MIL/uL   Hemoglobin 12.4 (*) 13.0 - 17.0  g/dL   HCT 37.4 (*) 39.0 - 52.0 %   MCV 85.4  78.0 - 100.0 fL   MCH 28.3  26.0 - 34.0 pg   MCHC 33.2  30.0 - 36.0 g/dL   RDW 14.4  11.5 - 15.5 %   Platelets 205  150 - 400 K/uL  GLUCOSE, CAPILLARY     Status: Abnormal   Collection Time    08/25/14  7:32 AM      Result Value Ref Range   Glucose-Capillary 132 (*) 70 - 99 mg/dL   Comment 1 Notify RN      US Scrotum  08/24/2014   CLINICAL DATA:  Pain, swelling, rash  EXAM: SCROTAL ULTRASOUND  DOPPLER ULTRASOUND OF THE TESTICLES  TECHNIQUE: Complete ultrasound examination of the testicles, epididymis, and other scrotal structures was performed. Color and spectral Doppler ultrasound were also utilized to evaluate blood flow to the testicles.  COMPARISON:  None.  FINDINGS: Right testicle  Measurements: 5.1 x 3 x 3.1 cm. No mass or microlithiasis visualized.  Left testicle  Measurements: 4.2 x 2.6 x 3.3 cm. No mass or microlithiasis visualized.  Right epididymis:  Normal in size.  Small spermatocele.  Left epididymis: Enlarged left epididymis with a heterogeneous appearance and increased Doppler flow relative to the right. 2.1 cm left spermatocele.  Hydrocele:  Bilateral hydroceles, right greater than left.  Varicocele:  None visualized.  Pulsed Doppler interrogation of both testes demonstrates low resistance arterial and venous waveforms bilaterally.  Other: Scrotal wall thickening. Soft tissue edema involving the perineum.  IMPRESSION: 1. Enlarged heterogeneous left epididymis with increased Doppler flow concerning for epididymitis. 2. Scrotal wall thickening and soft tissue edema extending into the perineum. 3. Bilateral hydroceles, right greater than left.   Electronically Signed   By: Kathreen Devoid   On: 08/24/2014 23:22   Ct Abdomen Pelvis W Contrast  08/25/2014   CLINICAL DATA:  Genital and right thigh pain and swelling. Rash. Evaluate for abscess.  EXAM: CT ABDOMEN AND PELVIS WITH CONTRAST  TECHNIQUE: Multidetector CT imaging of the abdomen and  pelvis was performed using the standard protocol following bolus administration of intravenous contrast.  CONTRAST:  178mL OMNIPAQUE IOHEXOL 300 MG/ML  SOLN  COMPARISON:  Ultrasound testes 08/24/2014. CT abdomen and pelvis 01/24/2011.  FINDINGS: Lung bases are clear.  The liver, spleen, gallbladder, pancreas, adrenal glands, abdominal aorta, inferior vena cava, and retroperitoneal lymph nodes are unremarkable. Small low-attenuation lesion in the lower pole right kidney measuring 2.3 cm diameter consistent with cyst. No hydronephrosis in either kidney. Stomach and small bowel are not abnormally distended. Stool in the colon without distention or wall thickening. No free air or free fluid in the abdomen.  Pelvis: Appendix is normal. Prostate gland is not enlarged. Bladder wall is not thickened. No free or loculated pelvic fluid collections. No pelvic lymphadenopathy. Left inguinal hernia  containing fat. Moderately prominent groin lymph nodes bilaterally are likely reactive. There is evidence of edema and skin thickening of the scrotum and right groin region consistent with cellulitis. There is a small soft tissue fluid collection in the right medial upper thigh measuring about 9 x 20 mm consistent with a small abscess. Suggestion of small loculated collection in the posterior inferior aspect of the scrotum towards the left and measuring 1.7 cm. Visualization is limited due to significant scrotal edema. Small hydroceles. Degenerative changes in the lumbar spine. No destructive bone lesions appreciated.  IMPRESSION: Extensive scrotal edema and skin thickening with infiltration in the scrotal and right groin subcutaneous fat consistent with cellulitis. Small collections are demonstrated in the right medial thigh and left posterior inferior scrotal skin consistent with small abscesses.   Electronically Signed   By: Lucienne Capers M.D.   On: 08/25/2014 03:33   [place] Art/ven Flow Abd Pelv Doppler  08/24/2014   CLINICAL  DATA:  Pain, swelling, rash  EXAM: SCROTAL ULTRASOUND  DOPPLER ULTRASOUND OF THE TESTICLES  TECHNIQUE: Complete ultrasound examination of the testicles, epididymis, and other scrotal structures was performed. Color and spectral Doppler ultrasound were also utilized to evaluate blood flow to the testicles.  COMPARISON:  None.  FINDINGS: Right testicle  Measurements: 5.1 x 3 x 3.1 cm. No mass or microlithiasis visualized.  Left testicle  Measurements: 4.2 x 2.6 x 3.3 cm. No mass or microlithiasis visualized.  Right epididymis:  Normal in size.  Small spermatocele.  Left epididymis: Enlarged left epididymis with a heterogeneous appearance and increased Doppler flow relative to the right. 2.1 cm left spermatocele.  Hydrocele:  Bilateral hydroceles, right greater than left.  Varicocele:  None visualized.  Pulsed Doppler interrogation of both testes demonstrates low resistance arterial and venous waveforms bilaterally.  Other: Scrotal wall thickening. Soft tissue edema involving the perineum.  IMPRESSION: 1. Enlarged heterogeneous left epididymis with increased Doppler flow concerning for epididymitis. 2. Scrotal wall thickening and soft tissue edema extending into the perineum. 3. Bilateral hydroceles, right greater than left.   Electronically Signed   By: Kathreen Devoid   On: 08/24/2014 23:22    Review of Systems  Constitutional: Negative for fever and chills.  Endo/Heme/Allergies: Does not bruise/bleed easily.   Blood pressure 131/64, pulse 81, temperature 99.6 F (37.6 C), temperature source Oral, resp. rate 18, height 5' 5.5" (1.664 m), weight 186 lb 11.7 oz (84.7 kg), SpO2 98.00%. Physical Exam  Constitutional: No distress.  Overweight male   HENT:  Head: Normocephalic and atraumatic.  Genitourinary:  Right groin induration with erythema and increased warmth, small superficial wound with no drainage.  Left hemiscrotal swelling with a irregular indurated lesion draining pus with multiple small areas.   Musculoskeletal:  Bilateral leg scar.    Assessment/Plan: 1.  Incompletely drained right groin abscess  2.  Left scrotal abscess  Plan:  Urology consult.  Will need urgent I & D of right groin abscess and likely the left scrotal abscess in the OR.  Discussed with the patient and he agrees.  Will await Urology consult before scheduling the procedure.  Sonny Poth J 08/25/2014, 7:55 AM

## 2014-08-25 NOTE — Brief Op Note (Signed)
08/24/2014 - 08/25/2014  10:26 AM  PATIENT:  Jose Anthony  53 y.o. male  PRE-OPERATIVE DIAGNOSIS:  groin and scrotal abscesses  POST-OPERATIVE DIAGNOSIS:  groin and scrotal abscesses  PROCEDURE:  Procedure(s): INCISION AND DRAINAGE ABSCESS (N/A) Scrotal. Excision skin biopsy <2cm.  SURGEON:  Surgeon(s) and Role:    * Sebastian Ache, MD -       PHYSICIAN ASSISTANT:   ASSISTANTS: Avel Peace MD   ANESTHESIA:   general  EBL:     BLOOD ADMINISTERED:none  DRAINS: Penrose drain in the left inferior scrotum   LOCAL MEDICATIONS USED:  NONE  SPECIMEN:  Source of Specimen:  scrotal abscess fluied - microbiology; scrotal skin - pathology  DISPOSITION OF SPECIMEN:  PATHOLOGY  COUNTS:  YES  TOURNIQUET:  * No tourniquets in log *  DICTATION: .Other Dictation: Dictation Number [id number]  PLAN OF CARE: Admit to inpatient   PATIENT DISPOSITION:  PACU - hemodynamically stable.   Delay start of Pharmacological VTE agent (>24hrs) due to surgical blood loss or risk of bleeding: not applicable

## 2014-08-25 NOTE — H&P (Signed)
[facility] History and Physical  Jose Anthony ZOX:[id number] DOB: 06/09/1961 DOA: 08/24/2014  Referring physician: ER physician PCP: No PCP Per Patient   Chief Complaint: groin abscess  HPI:  53 year old male with no significant past medical history who presented to [facility] ED 08/24/2014 with worsening rash in right groin area. Pt reported this started about 3 days prior to this admission as a rash. He put neosporin on the area and then it has gotten worse to the point he noticed purulent drainage. He started having fevers 1 day prior to this admission. He also reports some pain in the right groin area and scrotum. No reports of trauma to the area. No other complaints such as chest pain, palpitations or shortness of breath. No GI or GU complaints. In ED, he was found to have extensive area of 2 possible abscess in groin and perineum area. Surgery consulted by ED physician. Pt started on broad spectrum antibiotics, vanco and zosyn. CT abdomen/pelvis is pending.   Assessment & Plan    Principal Problem:   Abscess, groin / perineum  Will start with vanco and zosyn for now. Appreciate surgery consult and recommendations. Scrotal US showed enlarged left epididymis concerning for epididymitis. Arterial doppler of the scrotum revealed scrotal wall thickening and soft tissue edema extending into the perineum.   Obtain CT scan to exclude the possibility of Fournier's gangrene  May continue IV fluids, analgesia as need for pain control   DVT prophylaxis:   SCD's bilaterally   Radiological Exams on Admission: US Scrotum 08/24/2014 1. Enlarged heterogeneous left epididymis with increased Doppler flow concerning for epididymitis. 2. Scrotal wall thickening and soft tissue edema extending into the perineum. 3. Bilateral hydroceles, right greater than left.   [place] Art/ven Flow Abd Pelv Doppler 08/24/2014    1. Enlarged heterogeneous left epididymis with increased Doppler flow concerning for  epididymitis. 2. Scrotal wall thickening and soft tissue edema extending into the perineum. 3. Bilateral hydroceles, right greater than left.     Code Status: Full Family Communication: Plan of care discussed with the patient  Disposition Plan: Admit for further evaluation  Manson Passey, MD  [facility] Pager [phone]  Review of Systems:  Constitutional: Negative for fever, chills and malaise/fatigue. Negative for diaphoresis.  HENT: Negative for hearing loss, ear pain, nosebleeds, congestion, sore throat, neck pain, tinnitus and ear discharge.   Eyes: Negative for blurred vision, double vision, photophobia, pain, discharge and redness.  Respiratory: Negative for cough, hemoptysis, sputum production, shortness of breath, wheezing and stridor.   Cardiovascular: Negative for chest pain, palpitations, orthopnea, claudication and leg swelling.  Gastrointestinal: Negative for nausea, vomiting and abdominal pain. Negative for heartburn, constipation, blood in stool and melena.  Genitourinary: Negative for dysuria, urgency, frequency, hematuria and flank pain.  Musculoskeletal: Negative for myalgias, back pain, joint pain and falls.  Skin: per HPI Neurological: Negative for dizziness and weakness. Negative for tingling, tremors, sensory change, speech change, focal weakness, loss of consciousness and headaches.  Endo/Heme/Allergies: Negative for environmental allergies and polydipsia. Does not bruise/bleed easily.  Psychiatric/Behavioral: Negative for suicidal ideas. The patient is not nervous/anxious.      History reviewed. No pertinent past medical history. Past Surgical History  Procedure Laterality Date  . Joint replacement     Social History:  reports that he has never smoked. He does not have any smokeless tobacco history on file. He reports that he does not drink alcohol or use illicit drugs.  No Known Allergies  Family History:  Family History  Problem Relation Age of  Onset  . Cancer Mother   . Diabetes Mother   . Diabetes Father   . Diabetes Sister      Prior to Admission medications   Medication Sig Start Date End Date Taking? Authorizing Provider  acetaminophen (TYLENOL) 325 MG tablet Take 325-650 mg by mouth every 6 (six) hours as needed (pain).   Yes Historical Provider, MD  naproxen sodium (ANAPROX) 220 MG tablet Take 220 mg by mouth 2 (two) times daily as needed (pain).   Yes Historical Provider, MD   Physical Exam: Filed Vitals:   08/24/14 1905 08/24/14 2351  BP: 163/79 136/72  Pulse: 87 89  Temp: 98.9 F (37.2 C) 98.9 F (37.2 C)  TempSrc: Oral Oral  Resp: 18 18  SpO2: 99% 96%    Physical Exam  Constitutional: Appears well-developed and well-nourished. No distress.  HENT: Normocephalic. No tonsillar erythema or exudates Eyes: Conjunctivae and EOM are normal. PERRLA, no scleral icterus.  Neck: Normal ROM. Neck supple. No JVD. No tracheal deviation. No thyromegaly.  CVS: RRR, S1/S2 +, no murmurs, no gallops, no carotid bruit.  Pulmonary: Effort and breath sounds normal, no stridor, rhonchi, wheezes, rales.  Abdominal: Soft. BS +,  no distension, tenderness, rebound or guarding.  Musculoskeletal: Normal range of motion. No edema and no tenderness.  Lymphadenopathy: No lymphadenopathy noted, cervical, inguinal. Neuro: Alert. Normal reflexes, muscle tone coordination. No focal neurologic deficits. Skin: tinea cruris buttock and groin area, 10 cm x 6 cm area of induration and purulent draiange. Also seen is another abscess on left perineum 4 cm x 2 cm with purulent drainage.  Psychiatric: Normal mood and affect. Behavior, judgment, thought content normal.   Labs on Admission:  Basic Metabolic Panel:  Recent Labs Lab 08/24/14 2133  NA 137  K 3.8  CL 98  CO2 22  GLUCOSE 93  BUN 13  CREATININE 0.69  CALCIUM 9.0   Liver Function Tests:  Recent Labs Lab 08/24/14 2133  AST 20  ALT 26  ALKPHOS 96  BILITOT 0.8  PROT 7.6   ALBUMIN 3.8   No results found for this basename: LIPASE, AMYLASE,  in the last 168 hours No results found for this basename: AMMONIA,  in the last 168 hours CBC:  Recent Labs Lab 08/24/14 2132  WBC 17.5*  HGB 13.2  HCT 40.3  MCV 86.5  PLT 196   Cardiac Enzymes: No results found for this basename: CKTOTAL, CKMB, CKMBINDEX, TROPONINI,  in the last 168 hours BNP: No components found with this basename: POCBNP,  CBG:  Recent Labs Lab 08/24/14 2112  GLUCAP 89    If 7PM-7AM, please contact night-coverage [email or web address] Password [email or web address] 08/25/2014, 12:34 AM
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
## 2014-08-25 NOTE — Op Note (Signed)
Operative Note  Roen Macgowan male 53 y.o. 08/25/2014  PREOPERATIVE DX:  Right groin abscess  POSTOPERATIVE DX:  Same  PROCEDURE:   Complex incision and drainage of right groin abscess         Surgeon: Adolph Pollack   Assistants: Dwana Curd M.D.  Anesthesia: General endotracheal anesthesia  Indications:   This is a 53 year old male who is developed a complex right groin abscess. He also has a left scrotal abscess. He is now brought to the operating room for drainage of his complex right groin abscess. Dr. Berneice Heinrich is going to drain his left scrotal abscess.  The procedure and risks including but not limited to bleeding, recurrent infection, need for reoperation, wound healing problems were discussed with him.    Procedure Detail:  He was seen in the holding area in the right groin marked with my initials. He is brought to the operating room placed supine on the operating table Gen. Anesthetic was given. He was placed in the lithotomy position. The hair in the groin and scrotal areas with clips. These areas were sterilely prepped and draped.  There was an area of induration and fluctuance in the right groin. A full thickness elliptical incision was made through the skin and subcutaneous tissue at this site. Purulent fluid was evacuated, some of which was sent for culture. I then digitally broken up loculations. I extended the incision superiorly and removed more skin and subcutaneous tissue to create a open wound. Hemostasis was controlled electrocautery. No other loculated collections were found. Once hemostasis was adequate, the wound is packed with saline moistened gauze. Dr. Berneice Heinrich then proceeded with incision and drainage of the left scrotal abscess. Once he was finished, sterile dressings were applied.  He tolerated the procedures well without any apparent complications and was taken to the recovery in satisfactory condition.   Estimated Blood Loss:  less than 100 mL          Drains: none  Blood Given: none          Specimens: abscess fluid sent for culture        Complications:  * No complications entered in OR log *         Disposition: PACU - hemodynamically stable.         Condition: stable

## 2014-08-25 NOTE — Anesthesia Postprocedure Evaluation (Signed)
  Anesthesia Post-op Note  Patient: Jose Anthony  Procedure(s) Performed: Procedure(s) (LRB): INCISION AND DRAINAGE ABSCESS (N/A)  Patient Location: PACU  Anesthesia Type: General  Level of Consciousness: awake and alert   Airway and Oxygen Therapy: Patient Spontanous Breathing  Post-op Pain: mild  Post-op Assessment: Post-op Vital signs reviewed, Patient's Cardiovascular Status Stable, Respiratory Function Stable, Patent Airway and No signs of Nausea or vomiting  Last Vitals:  Filed Vitals:   08/25/14 0907  BP: 161/77  Pulse: 85  Temp: 36.8 C  Resp: 18    Post-op Vital Signs: stable   Complications: No apparent anesthesia complications

## 2014-08-26 DIAGNOSIS — L02219 Cutaneous abscess of trunk, unspecified: Principal | ICD-10-CM

## 2014-08-26 DIAGNOSIS — L03319 Cellulitis of trunk, unspecified: Principal | ICD-10-CM

## 2014-08-26 DIAGNOSIS — N498 Inflammatory disorders of other specified male genital organs: Secondary | ICD-10-CM

## 2014-08-26 LAB — GLUCOSE, CAPILLARY: GLUCOSE-CAPILLARY: 151 mg/dL — AB (ref 70–99)

## 2014-08-26 LAB — VANCOMYCIN, TROUGH: Vancomycin Tr: 12.4 ug/mL (ref 10.0–20.0)

## 2014-08-26 MED ORDER — VANCOMYCIN HCL 10 G IV SOLR
2000.0000 mg | Freq: Two times a day (BID) | INTRAVENOUS | Status: DC
  Administered 2014-08-26 – 2014-08-27 (×2): 2000 mg via INTRAVENOUS
  Filled 2014-08-26 (×3): qty 2000

## 2014-08-26 NOTE — Progress Notes (Addendum)
ANTIBIOTIC CONSULT NOTE - FOLLOW UP  Pharmacy Consult for Vancomycin, Zosyn Indication: groin and scrotal abscess  No Known Allergies  Patient Measurements: Height: 5' 5.5" (166.4 cm) Weight: 199 lb 1.6 oz (90.311 kg) IBW/kg (Calculated) : 62.65  Vital Signs: Temp: 97.7 F (36.5 C) (09/27 0610) Temp src: Oral (09/27 0610) BP: 130/73 mmHg (09/27 0610) Pulse Rate: 60 (09/27 0610) Intake/Output from previous day: 09/26 0701 - 09/27 0700 In: 1260 [P.O.:360; I.V.:900] Out: -  Intake/Output from this shift:    Labs:  Recent Labs  08/24/14 2132 08/24/14 2133 08/25/14 0536  WBC 17.5*  --  14.8*  HGB 13.2  --  12.4*  PLT 196  --  205  CREATININE  --  0.69 0.77   Estimated Creatinine Clearance: 111.3 ml/min (by C-G formula based on Cr of 0.77). 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  Test performance has     been validated by [employer] for patients greater     than or equal to 81 year old.     It is not intended     to diagnose infection nor to     guide or monitor treatment.     Assessment: 53 yr male with complaint of scrotum swelling with abscess. CT scan which showed extensive scrotal edema and skin thickening with infiltration in the  scrotal and right groin subcutaneous fat consistent with cellulitis. Small collections demonstrated in the right medial thigh and left posterior inferior scrotal skin consistent with small abscesses.  Surgery and urology consulted for I&D.  Pharmacy consulted to dose vancomycin and zosyn.  9/26 >> clinda x 1 >> 9/26 9/26 >>vanc >> 9/26 >>zosyn >>   Tmax: afebrile WBCs: elevated but trending down Renal: SCr 0.77, CrCl > 100 CG/N  Today is day #2 Vanc  q12h + Zosyn 3.375g IV q8h extended infusion for right groin abscess s/p I&D 9/26 and left scrotal abscess drained by urology 9/26.  Abscess tissue sent for culture.  Goal of Therapy:  Vancomycin trough level 15-20 mcg/ml Doses adjusted per renal function Eradication of infection  Plan:  1.  Check vancomycin trough this afternoon at steady state.  2.  Continue Zosyn 3.375g IV q8h (4 hour infusion time). 3.  F/u culture results.  Clance Boll 08/26/2014,7:09 AM   ADDENDUM 08/26/2014 2:15 PM Vancomycin trough subtherapeutic at 12.4. Abscess cultures both growing abundant staph aureus.  Plan: Adjust Vancomycin to 2g IV q12h. F/u culture results. F/u SCr, VT at new steady state.  Clance Boll, PharmD, BCPS Pager: [phone] 08/26/2014 2:17 PM

## 2014-08-26 NOTE — Op Note (Signed)
NAMESHRAVAN, SALAHUDDIN NO.:  [id number]  MEDICAL RECORD NO.:  [id number]  LOCATION:  1518                         FACILITY:  [facility]  PHYSICIAN:  Sebastian Ache, MD     DATE OF BIRTH:  [birth year]  DATE OF PROCEDURE: 08/25/2014 DATE OF DISCHARGE:                              OPERATIVE REPORT   DIAGNOSES: 1. Right groin and inferior scrotal skin abscesses. 2. Worrisome inferior scrotal skin lesion.  PROCEDURE: 1. Incision and drainage of scrotal abscess. 2. Excisional skin biopsy less than 2 cm.  DRAINS:  Penrose drain in the inferior scrotum to wound drainage.  ASSISTANT:  Adolph Pollack, M.D.  ESTIMATED BLOOD LOSS:  Nil.  SPECIMEN: 1. Inferior scrotal skin for permanent pathology. 2. Scrotal abscess fluid for Gram stain and culture.  INDICATION:  Jose Anthony is a very pleasant 53 year old gentleman without history of known diabetes or immune-compromised who presented with several-day prodrome of increasing pain in his right groin and left scrotum and exam consistent with likely abscesses.  He underwent imaging evaluation with pelvic CT as well as scrotal ultrasound, which revealed no actual involvement such as Fournier gangrene or any intrascrotal direct involvement, and General Surgery and Urologic consultation were sought.  Dr. Abbey Chatters and I both evaluated the patient and agreed that incision and drainage of the separate foci was warranted.  The scrotal area skin appeared somewhat fungated and worrisome for possible neoplasm as well, therefore excisional biopsy discussed as well.  Informed consent was obtained and placed in medical record.  PROCEDURE IN DETAIL:  The patient being Jose Anthony was verified, procedure being incision and drainage, skin biopsy was confirmed, procedure was carried out.  Time-out was performed.  Intravenous antibiotics were administered.  General endotracheal anesthesia was introduced.  The patient was placed into  a low lithotomy position. Sterile field was created with reverse clipper shaving, prepping, and draping his groin, penis, scrotum, perineum, and proximal thighs using iodine.  Next, Dr. Abbey Chatters performed incision and drainage of the right inguinal abscess as per separate dictated operative note. Attention was directed at the scrotal component.  An excisional biopsy was performed of diamond-shape scrotal skin overlying the area of induration and fluctuance, approximate total skin size 1.5 cm2.  This was passed off for permanent pathology and copious purulent material expressed.  Several loculations were broken up using hemostats and purulent fluid set aside for Gram stain and culture.  Additional hemostasis was achieved with point coagulation current cautery and upon inspection at the abscess tract inferiorly, but did not involve the rectum anteriorly, but did not involve the intrascrotal compartments. As such an approximately 8 cm Penrose drain was brought so that it traversed the apices of the abscess, brought out through the incision site, and anchored in place using nylon x2 as well as some 2-inch wide gauze packing for additional hemostasis.  ABD and mesh underwear applied.  Hemostasis appeared excellent.  Procedure was then terminated. The patient tolerated the procedure well.  There were no immediate periprocedural complications.  The patient was taken to the postanesthesia care unit in a stable condition.          ______________________________ Sebastian Ache, MD  TM/MEDQ  D:  08/25/2014  T:  08/25/2014  Job:  [id number]

## 2014-08-26 NOTE — Progress Notes (Signed)
1 Day Post-Op  Subjective: Very sore, hurts a little bit to walk. No n/v  Objective: Vital signs in last 24 hours: Temp:  [97.7 F (36.5 C)-99.1 F (37.3 C)] 97.7 F (36.5 C) (09/27 0610) Pulse Rate:  [60-101] 60 (09/27 0610) Resp:  [15-21] 16 (09/27 0610) BP: (125-169)/(63-82) 130/73 mmHg (09/27 0610) SpO2:  [97 %-100 %] 97 % (09/27 0610) Weight:  [199 lb 1.6 oz (90.311 kg)] 199 lb 1.6 oz (90.311 kg) (09/27 0600) Last BM Date: 08/24/14  Intake/Output from previous day: 09/26 0701 - 09/27 0700 In: 1260 [P.O.:360; I.V.:900] Out: -  Intake/Output this shift:    Alert, nad, nontoxic Rt upper thigh/infrainguinal area - large amount of induration, not much cellulitis. Packing removed. Some fibrinous exudate but no necrotic tissue.  Scrotal exam deferred  Lab Results:   Recent Labs  08/24/14 2132 08/25/14 0536  WBC 17.5* 14.8*  HGB 13.2 12.4*  HCT 40.3 37.4*  PLT 196 205   BMET  Recent Labs  08/24/14 2133 08/25/14 0536  NA 137 139  K 3.8 3.8  CL 98 102  CO2 22 25  GLUCOSE 93 135*  BUN 13 11  CREATININE 0.69 0.77  CALCIUM 9.0 8.4   PT/INR No results found for this basename: LABPROT, INR,  in the last 72 hours ABG No results found for this basename: PHART, PCO2, PO2, HCO3,  in the last 72 hours  Studies/Results: US Scrotum  08/24/2014   CLINICAL DATA:  Pain, swelling, rash  EXAM: SCROTAL ULTRASOUND  DOPPLER ULTRASOUND OF THE TESTICLES  TECHNIQUE: Complete ultrasound examination of the testicles, epididymis, and other scrotal structures was performed. Color and spectral Doppler ultrasound were also utilized to evaluate blood flow to the testicles.  COMPARISON:  None.  FINDINGS: Right testicle  Measurements: 5.1 x 3 x 3.1 cm. No mass or microlithiasis visualized.  Left testicle  Measurements: 4.2 x 2.6 x 3.3 cm. No mass or microlithiasis visualized.  Right epididymis:  Normal in size.  Small spermatocele.  Left epididymis: Enlarged left epididymis with a  heterogeneous appearance and increased Doppler flow relative to the right. 2.1 cm left spermatocele.  Hydrocele:  Bilateral hydroceles, right greater than left.  Varicocele:  None visualized.  Pulsed Doppler interrogation of both testes demonstrates low resistance arterial and venous waveforms bilaterally.  Other: Scrotal wall thickening. Soft tissue edema involving the perineum.  IMPRESSION: 1. Enlarged heterogeneous left epididymis with increased Doppler flow concerning for epididymitis. 2. Scrotal wall thickening and soft tissue edema extending into the perineum. 3. Bilateral hydroceles, right greater than left.   Electronically Signed   By: Elige Ko   On: 08/24/2014 23:22   Ct Abdomen Pelvis W Contrast  08/25/2014   CLINICAL DATA:  Genital and right thigh pain and swelling. Rash. Evaluate for abscess.  EXAM: CT ABDOMEN AND PELVIS WITH CONTRAST  TECHNIQUE: Multidetector CT imaging of the abdomen and pelvis was performed using the standard protocol following bolus administration of intravenous contrast.  CONTRAST:  OMNIPAQUE IOHEXOL 300 MG/ML  SOLN  COMPARISON:  Ultrasound testes 08/24/2014. CT abdomen and pelvis 01/24/2011.  FINDINGS: Lung bases are clear.  The liver, spleen, gallbladder, pancreas, adrenal glands, abdominal aorta, inferior vena cava, and retroperitoneal lymph nodes are unremarkable. Small low-attenuation lesion in the lower pole right kidney measuring 2.3 cm diameter consistent with cyst. No hydronephrosis in either kidney. Stomach and small bowel are not abnormally distended. Stool in the colon without distention or wall thickening. No free air or free fluid in  the abdomen.  Pelvis: Appendix is normal. Prostate gland is not enlarged. Bladder wall is not thickened. No free or loculated pelvic fluid collections. No pelvic lymphadenopathy. Left inguinal hernia containing fat. Moderately prominent groin lymph nodes bilaterally are likely reactive. There is evidence of edema and skin  thickening of the scrotum and right groin region consistent with cellulitis. There is a small soft tissue fluid collection in the right medial upper thigh measuring about 9 x 20 mm consistent with a small abscess. Suggestion of small loculated collection in the posterior inferior aspect of the scrotum towards the left and measuring 1.7 cm. Visualization is limited due to significant scrotal edema. Small hydroceles. Degenerative changes in the lumbar spine. No destructive bone lesions appreciated.  IMPRESSION: Extensive scrotal edema and skin thickening with infiltration in the scrotal and right groin subcutaneous fat consistent with cellulitis. Small collections are demonstrated in the right medial thigh and left posterior inferior scrotal skin consistent with small abscesses.   Electronically Signed   By: Burman Nieves M.D.   On: 08/25/2014 03:33   [place] Art/ven Flow Abd Pelv Doppler  08/24/2014   CLINICAL DATA:  Pain, swelling, rash  EXAM: SCROTAL ULTRASOUND  DOPPLER ULTRASOUND OF THE TESTICLES  TECHNIQUE: Complete ultrasound examination of the testicles, epididymis, and other scrotal structures was performed. Color and spectral Doppler ultrasound were also utilized to evaluate blood flow to the testicles.  COMPARISON:  None.  FINDINGS: Right testicle  Measurements: 5.1 x 3 x 3.1 cm. No mass or microlithiasis visualized.  Left testicle  Measurements: 4.2 x 2.6 x 3.3 cm. No mass or microlithiasis visualized.  Right epididymis:  Normal in size.  Small spermatocele.  Left epididymis: Enlarged left epididymis with a heterogeneous appearance and increased Doppler flow relative to the right. 2.1 cm left spermatocele.  Hydrocele:  Bilateral hydroceles, right greater than left.  Varicocele:  None visualized.  Pulsed Doppler interrogation of both testes demonstrates low resistance arterial and venous waveforms bilaterally.  Other: Scrotal wall thickening. Soft tissue edema involving the perineum.  IMPRESSION: 1.  Enlarged heterogeneous left epididymis with increased Doppler flow concerning for epididymitis. 2. Scrotal wall thickening and soft tissue edema extending into the perineum. 3. Bilateral hydroceles, right greater than left.   Electronically Signed   By: Elige Ko   On: 08/24/2014 23:22    Anti-infectives: Anti-infectives   Start     Dose/Rate Route Frequency Ordered Stop   08/25/14 0800  piperacillin-tazobactam (ZOSYN) IVPB 3.375 g     3.375 g 12.5 mL/hr over 240 Minutes Intravenous Every 8 hours 08/25/14 0058     08/25/14 0200  vancomycin (VANCOCIN) 1,500 mg in sodium chloride 0.9 % 500 mL IVPB     1,500 mg 250 mL/hr over 120 Minutes Intravenous Every 12 hours 08/25/14 0058     08/25/14 0100  piperacillin-tazobactam (ZOSYN) IVPB 3.375 g     3.375 g 100 mL/hr over 30 Minutes Intravenous STAT 08/25/14 0047 08/25/14 0205   08/25/14 0000  clindamycin (CLEOCIN) IVPB 600 mg  Status:  Discontinued     600 mg 100 mL/hr over 30 Minutes Intravenous  Once 08/25/14 0000 08/25/14 0034      Assessment/Plan: s/p Procedure(s): INCISION AND DRAINAGE ABSCESS (N/A)  Doing well. Wbc down. cx growing Staph so far Cont iv abx Removed packing from rt thigh. Given width of wound, will not require deep packing. Cover with dry gauze for drainage purposes. Can get wound wet in shower with soap and water and pat dry and  cover with gauze.  Defer scrotal instructions to Dr Berneice Heinrich  prob needs some addl iv abx. Maybe discharge Monday  Mary Sella. Andrey Campanile, MD, FACS General, Bariatric, & Minimally Invasive Surgery [facility], [place]   LOS: 2 days    Atilano Ina 08/26/2014

## 2014-08-26 NOTE — Progress Notes (Addendum)
[facility] PROGRESS NOTE  Jose Anthony ZOX:[id number] DOB: 1961-07-23 DOA: 08/24/2014 PCP: No PCP Per Patient  Assessment/Plan: 1. Groin and scrotal abscess -Patient presenting with complaints of increasing pain, swelling and purulent discharge since last Tuesday. -Initial evaluation included a CT scan which showed extensive scrotal edema and skin thickening with infiltration in the scrotal and right groin subcutaneous fat consistent with cellulitis. Small collections demonstrated in the right medial thigh and left posterior inferior scrotal skin consistent with small abscesses. -General surgery and urology consulted, patient taken to the OR on 08/25/2014 where he underwent incision and drainage. -Cultures growing Staphylococcus aureus, pending susceptibility testing -Meanwhile will continue empiric IV antibiotic therapy with Zosyn and Vancomycin -Plan to narrow antibiotic spectrum once cultures finalize  Code Status: Full code Family Communication: Discussed case with patient's wife who was present at bedside Disposition Plan: Continue broad-spectrum IV antibiotic therapy, supportive care   Consultants:  General surgery  Urology  Procedures:  Incision and drainage of right groin and scrotal abscess  Antibiotics:  Vancomycin IV started on 08/25/2014  Zosyn IV started on 08/25/2014  HPI/Subjective: Patient is a pleasant 53 year old with no significant past medical history who was admitted to the medicine service on 08/25/2014. Patient presented with worsening erythema, pain and swelling involving the right groin area. Stated this started last Tuesday as a boil. He noticed purulent drainage as well as the development of fevers and chills one day prior to his hospitalization. In the emergency room he was found to have abscess in the groin and scrotum, general surgery and urology were consulted. He was taken to the OR on 08/25/2014 undergoing incision and  drainage.  Objective: Filed Vitals:   08/26/14 0610  BP: 130/73  Pulse: 60  Temp: 97.7 F (36.5 C)  Resp: 16   No intake or output data in the 24 hours ending 08/26/14 1344 Filed Weights   08/25/14 0104 08/25/14 0552 08/26/14 0600  Weight: 84.777 kg (186 lb 14.4 oz) 84.7 kg (186 lb 11.7 oz) 90.311 kg (199 lb 1.6 oz)    Exam:   General:  Patient states doing well, he is awake alert oriented, nontoxic appearing  Cardiovascular: Regular rate and rhythm normal S1-S2  Respiratory: Overall clear to auscultation bilaterally, normal respiratory effort  Abdomen: Soft nontender nondistended  Musculoskeletal: Status post incision and drainage to right growing and scrotal abscess, surgical incision sites appear clean there is no evidence of purulent drainage or active bleeding  Data Reviewed: Basic Metabolic Panel:  Recent Labs Lab 08/24/14 2133 08/25/14 0536  NA 137 139  K 3.8 3.8  CL 98 102  CO2 22 25  GLUCOSE 93 135*  BUN 13 11  CREATININE 0.69 0.77  CALCIUM 9.0 8.4   Liver Function Tests:  Recent Labs Lab 08/24/14 2133 08/25/14 0536  AST 20 26  ALT 26 29  ALKPHOS 96 116  BILITOT 0.8 0.7  PROT 7.6 6.8  ALBUMIN 3.8 3.2*   No results found for this basename: LIPASE, AMYLASE,  in the last 168 hours No results found for this basename: AMMONIA,  in the last 168 hours CBC:  Recent Labs Lab 08/24/14 2132 08/25/14 0536  WBC 17.5* 14.8*  HGB 13.2 12.4*  HCT 40.3 37.4*  MCV 86.5 85.4  PLT 196 205   Cardiac Enzymes: No results found for this basename: CKTOTAL, CKMB, CKMBINDEX, TROPONINI,  in the last 168 hours BNP (last 3 results) No results found for this basename: PROBNP,  in the last 8760 hours CBG:  Recent Labs Lab 08/24/14 2112 08/25/14 0732 08/26/14 0551  GLUCAP 89 132* 151*    Recent Results (from the past 240 hour(s))  SURGICAL PCR SCREEN     Status: Abnormal   Collection Time    08/25/14  9:04 AM      Result Value Ref Range Status    MRSA, 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  Test performance has     been validated by [employer] for patients greater     than or equal to 33 year old.     It is not intended     to diagnose infection nor to     guide or monitor treatment.  ANAEROBIC CULTURE     Status: None   Collection Time    08/25/14  9:35 AM      Result Value Ref Range Status   Specimen Description GROIN RIGHT   Final   Special Requests     Final   Value: PATIENT ON FOLLOWING ZOSYN, CLINDAMYCIN AND VANCOMYCIN   Gram Stain PENDING   Incomplete   Culture     Final   Value: NO ANAEROBES ISOLATED; CULTURE IN PROGRESS FOR 5 DAYS     Performed at [facility]   Report Status PENDING   Incomplete  CULTURE, ROUTINE-ABSCESS     Status: None   Collection Time    08/25/14  9:35 AM      Result Value Ref Range Status   Specimen Description GROIN RIGHT   Final   Special Requests     Final   Value: PATIENT ON FOLLOWING ZOSYN, CLINDAMYCIN AND VANCOMYCIN   Gram Stain PENDING   Incomplete   Culture     Final   Value: ABUNDANT STAPHYLOCOCCUS AUREUS     Note: RIFAMPIN AND GENTAMICIN SHOULD NOT BE USED AS SINGLE DRUGS FOR TREATMENT OF STAPH INFECTIONS.     Performed at [facility]   Report Status PENDING   Incomplete  ANAEROBIC CULTURE     Status: None   Collection Time    08/25/14 10:08 AM      Result Value Ref Range Status   Specimen Description SCROTUM SCROTAL ABSCESS   Final   Special Requests     Final   Value: PATIENT ON FOLLOWING ZOSYN, CLINDAMYCIN AND VANCOMYCIN   Gram Stain PENDING   Incomplete   Culture     Final   Value: NO ANAEROBES ISOLATED;  CULTURE IN PROGRESS FOR 5 DAYS     Performed at [facility]   Report Status PENDING   Incomplete  CULTURE, ROUTINE-ABSCESS     Status: None   Collection Time    08/25/14 10:08 AM      Result Value Ref Range Status   Specimen Description SCROTUM SCROTAL ABSCESS   Final   Special Requests     Final   Value: PATIENT ON FOLLOWING ZOSYN, CLINDAMYCIN AND VANCOMYCIN   Gram Stain PENDING   Incomplete  Culture     Final   Value: ABUNDANT STAPHYLOCOCCUS AUREUS     Note: RIFAMPIN AND GENTAMICIN SHOULD NOT BE USED AS SINGLE DRUGS FOR TREATMENT OF STAPH INFECTIONS.     Performed at [facility]   Report Status PENDING   Incomplete     Studies: US Scrotum  08/24/2014   CLINICAL DATA:  Pain, swelling, rash  EXAM: SCROTAL ULTRASOUND  DOPPLER ULTRASOUND OF THE TESTICLES  TECHNIQUE: Complete ultrasound examination of the testicles, epididymis, and other scrotal structures was performed. Color and spectral Doppler ultrasound were also utilized to evaluate blood flow to the testicles.  COMPARISON:  None.  FINDINGS: Right testicle  Measurements: 5.1 x 3 x 3.1 cm. No mass or microlithiasis visualized.  Left testicle  Measurements: 4.2 x 2.6 x 3.3 cm. No mass or microlithiasis visualized.  Right epididymis:  Normal in size.  Small spermatocele.  Left epididymis: Enlarged left epididymis with a heterogeneous appearance and increased Doppler flow relative to the right. 2.1 cm left spermatocele.  Hydrocele:  Bilateral hydroceles, right greater than left.  Varicocele:  None visualized.  Pulsed Doppler interrogation of both testes demonstrates low resistance arterial and venous waveforms bilaterally.  Other: Scrotal wall thickening. Soft tissue edema involving the perineum.  IMPRESSION: 1. Enlarged heterogeneous left epididymis with increased Doppler flow concerning for epididymitis. 2. Scrotal wall thickening and soft tissue edema extending into the perineum. 3. Bilateral hydroceles, right greater than  left.   Electronically Signed   By: Elige Ko   On: 08/24/2014 23:22   Ct Abdomen Pelvis W Contrast  08/25/2014   CLINICAL DATA:  Genital and right thigh pain and swelling. Rash. Evaluate for abscess.  EXAM: CT ABDOMEN AND PELVIS WITH CONTRAST  TECHNIQUE: Multidetector CT imaging of the abdomen and pelvis was performed using the standard protocol following bolus administration of intravenous contrast.  CONTRAST:  OMNIPAQUE IOHEXOL 300 MG/ML  SOLN  COMPARISON:  Ultrasound testes 08/24/2014. CT abdomen and pelvis 01/24/2011.  FINDINGS: Lung bases are clear.  The liver, spleen, gallbladder, pancreas, adrenal glands, abdominal aorta, inferior vena cava, and retroperitoneal lymph nodes are unremarkable. Small low-attenuation lesion in the lower pole right kidney measuring 2.3 cm diameter consistent with cyst. No hydronephrosis in either kidney. Stomach and small bowel are not abnormally distended. Stool in the colon without distention or wall thickening. No free air or free fluid in the abdomen.  Pelvis: Appendix is normal. Prostate gland is not enlarged. Bladder wall is not thickened. No free or loculated pelvic fluid collections. No pelvic lymphadenopathy. Left inguinal hernia containing fat. Moderately prominent groin lymph nodes bilaterally are likely reactive. There is evidence of edema and skin thickening of the scrotum and right groin region consistent with cellulitis. There is a small soft tissue fluid collection in the right medial upper thigh measuring about 9 x 20 mm consistent with a small abscess. Suggestion of small loculated collection in the posterior inferior aspect of the scrotum towards the left and measuring 1.7 cm. Visualization is limited due to significant scrotal edema. Small hydroceles. Degenerative changes in the lumbar spine. No destructive bone lesions appreciated.  IMPRESSION: Extensive scrotal edema and skin thickening with infiltration in the scrotal and right groin subcutaneous  fat consistent with cellulitis. Small collections are demonstrated in the right medial thigh and left posterior inferior scrotal skin consistent with small abscesses.   Electronically Signed   By: Burman Nieves M.D.   On: 08/25/2014 03:33   [place] Art/ven Flow Abd Pelv Doppler  08/24/2014   CLINICAL DATA:  Pain, swelling, rash  EXAM: SCROTAL ULTRASOUND  DOPPLER ULTRASOUND OF THE TESTICLES  TECHNIQUE: Complete ultrasound examination of the testicles, epididymis, and other scrotal structures was performed. Color and spectral Doppler ultrasound were also utilized to evaluate blood flow to the testicles.  COMPARISON:  None.  FINDINGS: Right testicle  Measurements: 5.1 x 3 x 3.1 cm. No mass or microlithiasis visualized.  Left testicle  Measurements: 4.2 x 2.6 x 3.3 cm. No mass or microlithiasis visualized.  Right epididymis:  Normal in size.  Small spermatocele.  Left epididymis: Enlarged left epididymis with a heterogeneous appearance and increased Doppler flow relative to the right. 2.1 cm left spermatocele.  Hydrocele:  Bilateral hydroceles, right greater than left.  Varicocele:  None visualized.  Pulsed Doppler interrogation of both testes demonstrates low resistance arterial and venous waveforms bilaterally.  Other: Scrotal wall thickening. Soft tissue edema involving the perineum.  IMPRESSION: 1. Enlarged heterogeneous left epididymis with increased Doppler flow concerning for epididymitis. 2. Scrotal wall thickening and soft tissue edema extending into the perineum. 3. Bilateral hydroceles, right greater than left.   Electronically Signed   By: Elige Ko   On: 08/24/2014 23:22    Scheduled Meds: . Chlorhexidine Gluconate Cloth  6 each Topical Q0600  . mupirocin ointment  1 application Nasal BID  . piperacillin-tazobactam (ZOSYN)  IV  3.375 g Intravenous Q8H  . vancomycin  1,500 mg Intravenous Q12H   Continuous Infusions:   Principal Problem:   Abscess    Time spent: 25 min    Jeralyn Bennett  [facility] Pager [phone]. If 7PM-7AM, please contact night-coverage at [email or web address], password [facility] 08/26/2014, 1:44 PM  LOS: 2 days

## 2014-08-27 ENCOUNTER — Encounter (HOSPITAL_COMMUNITY): Payer: Self-pay | Admitting: Urology

## 2014-08-27 DIAGNOSIS — L02214 Cutaneous abscess of groin: Secondary | ICD-10-CM | POA: Diagnosis present

## 2014-08-27 DIAGNOSIS — N492 Inflammatory disorders of scrotum: Secondary | ICD-10-CM | POA: Diagnosis present

## 2014-08-27 LAB — CULTURE, ROUTINE-ABSCESS

## 2014-08-27 LAB — CBC
HCT: 39 % (ref 39.0–52.0)
Hemoglobin: 12.7 g/dL — ABNORMAL LOW (ref 13.0–17.0)
MCH: 28.6 pg (ref 26.0–34.0)
MCHC: 32.6 g/dL (ref 30.0–36.0)
MCV: 87.8 fL (ref 78.0–100.0)
PLATELETS: 223 10*3/uL (ref 150–400)
RBC: 4.44 MIL/uL (ref 4.22–5.81)
RDW: 14.2 % (ref 11.5–15.5)
WBC: 8.9 10*3/uL (ref 4.0–10.5)

## 2014-08-27 LAB — BASIC METABOLIC PANEL
ANION GAP: 12 (ref 5–15)
BUN: 13 mg/dL (ref 6–23)
CHLORIDE: 105 meq/L (ref 96–112)
CO2: 25 mEq/L (ref 19–32)
CREATININE: 0.81 mg/dL (ref 0.50–1.35)
Calcium: 9 mg/dL (ref 8.4–10.5)
Glucose, Bld: 101 mg/dL — ABNORMAL HIGH (ref 70–99)
Potassium: 4.4 mEq/L (ref 3.7–5.3)
Sodium: 142 mEq/L (ref 137–147)

## 2014-08-27 LAB — GLUCOSE, CAPILLARY: Glucose-Capillary: 128 mg/dL — ABNORMAL HIGH (ref 70–99)

## 2014-08-27 MED ORDER — CLINDAMYCIN HCL 300 MG PO CAPS: 300.0000 mg | ORAL_CAPSULE | Freq: Three times a day (TID) | ORAL | Status: DC

## 2014-08-27 MED ORDER — OXYCODONE HCL 5 MG PO TABS: 5.0000 mg | ORAL_TABLET | Freq: Four times a day (QID) | ORAL | Status: DC | PRN

## 2014-08-27 MED ORDER — HYDROCORTISONE 1 % EX OINT: 1.0000 "application " | TOPICAL_OINTMENT | Freq: Two times a day (BID) | CUTANEOUS | Status: AC

## 2014-08-27 NOTE — Progress Notes (Signed)
2 Days Post-Op  Subjective:  1 - Scrotal and Groin Abscesses - s/p operative I+D of abscesses.  08/25/14 in combined gen surg Urology procedure. CX's staph / pending. MRSA swab positive.   2 - Bilateral Hydroceles - small asymptomatic bilateral hydroceles by [place], pt states non-bothersome and stable in size x years.   3 - Mild Epididymitis - scrotal US on admission with some epidiymal hypervascularity c/w possible mild epiidiymitis. No frank scrotal compartment abscesses / gas. UA unremarkable on ER labs.   Today Jose Anthony is stable. NO fevers. Soreness with ambulation as expected.    Objective: Vital signs in last 24 hours: Temp:  [98 F (36.7 C)-98.7 F (37.1 C)] 98 F (36.7 C) (09/28 0541) Pulse Rate:  [62-66] 62 (09/28 0541) Resp:  [18] 18 (09/28 0541) BP: (149-165)/(77-85) 163/83 mmHg (09/28 0541) SpO2:  [94 %-99 %] 99 % (09/28 0541) Weight:  [87.454 kg (192 lb 12.8 oz)] 87.454 kg (192 lb 12.8 oz) (09/28 0541) Last BM Date: 08/24/14  Intake/Output from previous day:   Intake/Output this shift:    General appearance: alert, cooperative and appears stated age Nose: Nares normal. Septum midline. Mucosa normal. No drainage or sinus tenderness. Throat: lips, mucosa, and tongue normal; teeth and gums normal Back: symmetric, no curvature. ROM normal. No CVA tenderness. Resp: non-labored on room air Cardio: Nl rate GI: soft, non-tender; bowel sounds normal; no masses,  no organomegaly Male genitalia: normal, left inferior scrotal wound much softer, no tracking erythema. gauze packing removed, penrose left in place to wound drainage. Extremities: extremities normal, atraumatic, no cyanosis or edema Pulses: 2+ and symmetric Skin: Skin color, texture, turgor normal. No rashes or lesions Lymph nodes: Cervical, supraclavicular, and axillary nodes normal. Neurologic: Grossly normal Incision/Wound: as per above. Also Rt groin area c/d/i with improved induraiton.   Lab Results:    Recent Labs  08/25/14 0536 08/27/14 0437  WBC 14.8* 8.9  HGB 12.4* 12.7*  HCT 37.4* 39.0  PLT 205 223   BMET  Recent Labs  08/25/14 0536 08/27/14 0437  NA 139 142  K 3.8 4.4  CL 102 105  CO2 25 25  GLUCOSE 135* 101*  BUN 11 13  CREATININE 0.77 0.81  CALCIUM 8.4 9.0   PT/INR No results found for this basename: LABPROT, INR,  in the last 72 hours ABG No results found for this basename: PHART, PCO2, PO2, HCO3,  in the last 72 hours  Studies/Results: No results found.  Anti-infectives: Anti-infectives   Start     Dose/Rate Route Frequency Ordered Stop   08/26/14 1500  vancomycin (VANCOCIN) 2,000 mg in sodium chloride 0.9 % 500 mL IVPB     2,000 mg 250 mL/hr over 120 Minutes Intravenous Every 12 hours 08/26/14 1412     08/25/14 0800  piperacillin-tazobactam (ZOSYN) IVPB 3.375 g     3.375 g 12.5 mL/hr over 240 Minutes Intravenous Every 8 hours 08/25/14 0058     08/25/14 0200  vancomycin (VANCOCIN) 1,500 mg in sodium chloride 0.9 % 500 mL IVPB  Status:  Discontinued     1,500 mg 250 mL/hr over 120 Minutes Intravenous Every 12 hours 08/25/14 0058 08/26/14 1405   08/25/14 0100  piperacillin-tazobactam (ZOSYN) IVPB 3.375 g     3.375 g 100 mL/hr over 30 Minutes Intravenous STAT 08/25/14 0047 08/25/14 0205   08/25/14 0000  clindamycin (CLEOCIN) IVPB 600 mg  Status:  Discontinued     600 mg 100 mL/hr over 30 Minutes Intravenous  Once 08/25/14 0000 08/25/14  1610      Assessment/Plan:   1 - Scrotal and Groin Abscesses - likely MRSA abscess both improving clinically after I+D and on empiric ABX. IN terms of scrotal area, we will arrange for GU f/u later this week for office drain removal. Agree with keep in house unitle CX finalized to best tailor PO ABX regimen at DC.  2 - Bilateral Hydroceles - non-bothersoem, observe.  3 - Mild Epididymitis - unimpressive exam and [place], likley inflammation 2/2 wound nearby. Observe.  Will follow.   [facility], Jose Anthony 08/27/2014

## 2014-08-27 NOTE — Progress Notes (Signed)
Patient ID: Jose Anthony, male   DOB: Apr 18, 1961, 53 y.o.   MRN: [id number]     [place]      [address] [place], [place] [address]    Phone: [phone] FAX: [phone]     Subjective: No issues.  VSS.  Afebrile.    Objective:  Vital signs:  Filed Vitals:   08/26/14 0610 08/26/14 1451 08/26/14 2107 08/27/14 0541  BP: 130/73 149/77 165/85 163/83  Pulse: 60 64 66 62  Temp: 97.7 F (36.5 C) 98.7 F (37.1 C) 98.6 F (37 C) 98 F (36.7 C)  TempSrc: Oral Oral Oral Oral  Resp: _0 Height:      Weight:    192 lb 12.8 oz (87.454 kg)  SpO2: 97% 94% 99% 99%    Last BM Date: 08/24/14  Intake/Output   Yesterday:    This shift:   Physical Exam: General: Pt awake/alert/oriented x4 in no  acute distress Skin: right groin wound is open, serosanguinous drainage, surrounding edema/induration without any areas of fluctuance or erythema, repacked.    Problem List:   Principal Problem:   Abscess    Results:   Labs: Results for orders placed during the hospital encounter of 08/24/14 (from the past 48 hour(s))  SURGICAL PCR SCREEN     Status: Abnormal   Collection Time    08/25/14  9:04 AM      Result Value Ref Range   MRSA, PCR POSITIVE (*) NEGATIVE   Comment: RESULT CALLED TO, READ BACK BY AND VERIFIED WITH:     C.RUSSELL AT 1238 ON 26SEP15 BY C.BONGEL   Staphylococcus aureus POSITIVE (*) NEGATIVE   Comment:            The Xpert SA Assay (FDA     approved for NASAL specimens     in patients over 57 years of age),     is one component of     a comprehensive surveillance     program.  Test performance has     been validated by [employer] for patients greater     than or equal to 79 year old.     It is not intended     to diagnose infection nor to     guide or monitor treatment.  ANAEROBIC CULTURE     Status: None   Collection Time    08/25/14  9:35 AM      Result Value Ref Range   Specimen  Description GROIN RIGHT     Special Requests       Value: PATIENT ON FOLLOWING ZOSYN, CLINDAMYCIN AND VANCOMYCIN   Gram Stain PENDING     Culture       Value: NO ANAEROBES ISOLATED; CULTURE IN PROGRESS FOR 5 DAYS     Performed at [facility]   Report Status PENDING    CULTURE, ROUTINE-ABSCESS     Status: None   Collection Time    08/25/14  9:35 AM      Result Value Ref Range   Specimen Description GROIN RIGHT     Special Requests       Value: PATIENT ON FOLLOWING ZOSYN, CLINDAMYCIN AND VANCOMYCIN   Gram Stain       Value: ABUNDANT WBC PRESENT, PREDOMINANTLY PMN     RARE SQUAMOUS EPITHELIAL CELLS PRESENT     ABUNDANT GRAM POSITIVE COCCI IN PAIRS     IN CLUSTERS  Performed at [facility]   Culture       Value: ABUNDANT STAPHYLOCOCCUS AUREUS     Note: RIFAMPIN AND GENTAMICIN SHOULD NOT BE USED AS SINGLE DRUGS FOR TREATMENT OF STAPH INFECTIONS.     Performed at [facility]   Report Status PENDING    ANAEROBIC CULTURE     Status: None   Collection Time    08/25/14 10:08 AM      Result Value Ref Range   Specimen Description SCROTUM SCROTAL ABSCESS     Special Requests       Value: PATIENT ON FOLLOWING ZOSYN, CLINDAMYCIN AND VANCOMYCIN   Gram Stain PENDING     Culture       Value: NO ANAEROBES ISOLATED; CULTURE IN PROGRESS FOR 5 DAYS     Performed at [facility]   Report Status PENDING    CULTURE, ROUTINE-ABSCESS     Status: None   Collection Time    08/25/14 10:08 AM      Result Value Ref Range   Specimen Description SCROTUM SCROTAL ABSCESS     Special Requests       Value: PATIENT ON FOLLOWING ZOSYN, CLINDAMYCIN AND VANCOMYCIN   Gram Stain       Value: ABUNDANT WBC PRESENT, PREDOMINANTLY PMN     NO SQUAMOUS EPITHELIAL CELLS SEEN     MODERATE GRAM POSITIVE COCCI IN PAIRS     IN CLUSTERS     Performed at [facility]   Culture       Value: ABUNDANT STAPHYLOCOCCUS AUREUS     Note: RIFAMPIN AND GENTAMICIN SHOULD NOT BE USED AS  SINGLE DRUGS FOR TREATMENT OF STAPH INFECTIONS.     Performed at [facility]   Report Status PENDING    GLUCOSE, CAPILLARY     Status: Abnormal   Collection Time    08/26/14  5:51 AM      Result Value Ref Range   Glucose-Capillary 151 (*) 70 - 99 mg/dL   Comment 1 Notify RN    VANCOMYCIN, TROUGH     Status: None   Collection Time    08/26/14  1:00 PM      Result Value Ref Range   Vancomycin Tr 12.4  10.0 - 20.0 ug/mL  CBC     Status: Abnormal   Collection Time    08/27/14  4:37 AM      Result Value Ref Range   WBC 8.9  4.0 - 10.5 K/uL   RBC 4.44  4.22 - 5.81 MIL/uL   Hemoglobin 12.7 (*) 13.0 - 17.0 g/dL   HCT 39.0  39.0 - 52.0 %   MCV 87.8  78.0 - 100.0 fL   MCH 28.6  26.0 - 34.0 pg   MCHC 32.6  30.0 - 36.0 g/dL   RDW 14.2  11.5 - 15.5 %   Platelets 223  150 - 400 K/uL  BASIC METABOLIC PANEL     Status: Abnormal   Collection Time    08/27/14  4:37 AM      Result Value Ref Range   Sodium 142  137 - 147 mEq/L   Potassium 4.4  3.7 - 5.3 mEq/L   Chloride 105  96 - 112 mEq/L   CO2 25  19 - 32 mEq/L   Glucose, Bld 101 (*) 70 - 99 mg/dL   BUN 13  6 - 23 mg/dL   Creatinine, Ser 0.81  0.50 - 1.35 mg/dL   Calcium 9.0  8.4 - 10.5 mg/dL   GFR calc non Af Amer >90  >90 mL/min   GFR calc Af Amer >90  >90 mL/min   Comment: (NOTE)     The eGFR has been calculated using the CKD EPI equation.     This calculation has not been validated in all clinical situations.     eGFR's persistently <90 mL/min signify possible Chronic Kidney     Disease.   Anion gap 12  5 - 15  GLUCOSE, CAPILLARY     Status: Abnormal   Collection Time    08/27/14  7:16 AM      Result Value Ref Range   Glucose-Capillary 128 (*) 70 - 99 mg/dL   Comment 1 Notify RN      Imaging / Studies: No results found.  Medications / Allergies:  Scheduled Meds: . Chlorhexidine Gluconate Cloth  6 each Topical Q0600  . mupirocin ointment  1 application Nasal BID  . vancomycin  2,000 mg Intravenous Q12H    Continuous Infusions:  PRN Meds:.acetaminophen, acetaminophen, morphine injection, ondansetron (ZOFRAN) IV, ondansetron, oxyCODONE  Antibiotics: Anti-infectives   Start     Dose/Rate Route Frequency Ordered Stop   08/26/14 1500  vancomycin (VANCOCIN) 2,000 mg in sodium chloride 0.9 % 500 mL IVPB     2,000 mg 250 mL/hr over 120 Minutes Intravenous Every 12 hours 08/26/14 1412     08/25/14 0800  piperacillin-tazobactam (ZOSYN) IVPB 3.375 g  Status:  Discontinued     3.375 g 12.5 mL/hr over 240 Minutes Intravenous Every 8 hours 08/25/14 0058 08/27/14 0752   08/25/14 0200  vancomycin (VANCOCIN) 1,500 mg in sodium chloride 0.9 % 500 mL IVPB  Status:  Discontinued     1,500 mg 250 mL/hr over 120 Minutes Intravenous Every 12 hours 08/25/14 0058 08/26/14 1405   08/25/14 0100  piperacillin-tazobactam (ZOSYN) IVPB 3.375 g     3.375 g 100 mL/hr over 30 Minutes Intravenous STAT 08/25/14 0047 08/25/14 0205   08/25/14 0000  clindamycin (CLEOCIN) IVPB 600 mg  Status:  Discontinued     600 mg 100 mL/hr over 30 Minutes Intravenous  Once 08/25/14 0000 08/25/14 0034        Assessment/Plan POD#2 I&D scrotal and right groin abscess -may shower and cleanse this area with mild soap and water -staph on cultures, awaiting final results -BID wet to dry dressing changes -discharge per primary team and urology, will arrange his follow up to evaluate the groin wound which is open, without surrounding erythema, but still with induration. -will follow along  Erby Pian, [facility] Surgery Pager [phone])  08/27/2014 8:59 AM

## 2014-08-27 NOTE — Progress Notes (Signed)
Discharge instructions reviewed with patient. Patient verbalizes understanding. Patient does not have questions at this time. Patient confirms he has all personal belongings in possession at this time.

## 2014-08-27 NOTE — Progress Notes (Signed)
Discussed with NP  Hailley Byers M. Aloys Hupfer, MD, FACS General, Bariatric, & Minimally Invasive Surgery [facility]  

## 2014-08-27 NOTE — Discharge Summary (Signed)
Physician Discharge Summary  Jose Anthony ZOX:[id number] DOB: 1961/05/04 DOA: 08/24/2014  PCP: No PCP Per Patient  Admit date: 08/24/2014 Discharge date: 08/27/2014  Time spent: 35 minutes  Recommendations for Outpatient Follow-up:  1. Please follow up on surgical wound from I&D  Discharge Diagnoses:  Principal Problem:   Abscess of groin, right Active Problems:   Scrotal abscess   Discharge Condition: Stable  Diet recommendation: Regular Diet  Filed Weights   08/25/14 0552 08/26/14 0600 08/27/14 0541  Weight: 84.7 kg (186 lb 11.7 oz) 90.311 kg (199 lb 1.6 oz) 87.454 kg (192 lb 12.8 oz)    History of present illness:  53 year old male with no significant past medical history who presented to [facility] ED 08/24/2014 with worsening rash in right groin area. Pt reported this started about 3 days prior to this admission as a rash. He put neosporin on the area and then it has gotten worse to the point he noticed purulent drainage. He started having fevers 1 day prior to this admission. He also reports some pain in the right groin area and scrotum. No reports of trauma to the area. No other complaints such as chest pain, palpitations or shortness of breath. No GI or GU complaints.  In ED, he was found to have extensive area of 2 possible abscess in groin and perineum area. Surgery consulted by ED physician. Pt started on broad spectrum antibiotics, vanco and zosyn. CT abdomen/pelvis is pending.    Hospital Course:  Patient is a pleasant 53 year old gentleman with a significant past medical history he was admitted to the medicine service on 08/25/2014, presented with worsening erythema, pain and swelling involving the right groin area. He stated that this had started 5 days prior to this hospitalization noting increasing purulent drainage as well as development of subjective fevers and chills. He was initially worked up with a CT scan of that abdomen and pelvis which revealed extensive scrotal  edema and skin thickening with infiltration in the scrotal and right groin subcutaneous fat consistent with cellulitis, small collections demonstrated in the right medial thigh and left posterior inferior scrotal skin consistent with small abscesses. He was started on broad-spectrum IV antibiotic therapy with vancomycin and Zosyn. General surgery and urology were consulted as he was taken to the OR on 08/25/2014 undergoing incision and drainage. She tolerated procedure well there are no immediate complications. Cultures grew methicillin-resistant Staphylococcus aureus, organism susceptible to clindamycin by mouth. Given clinical stability and overall improvement he was discharged home on 08/27/2014 on clindamycin 300 mg 3 times a day for one week.   Procedures: PROCEDURE:  1. Incision and drainage of scrotal abscess.  2. Excisional skin biopsy less than 2 cm. DRAINS: Penrose drain in the inferior scrotum to wound drainage.   Consultations:  Urology  General surgery  Discharge Exam: Filed Vitals:   08/27/14 0541  BP: 163/83  Pulse: 62  Temp: 98 F (36.7 C)  Resp: 18    General: Patient states doing well, he is awake alert oriented, nontoxic appearing  Cardiovascular: Regular rate and rhythm normal S1-S2  Respiratory: Overall clear to auscultation bilaterally, normal respiratory effort  Abdomen: Soft nontender nondistended  Musculoskeletal: Status post incision and drainage to right growing and scrotal abscess, surgical incision sites appear clean there is no evidence of purulent drainage or active bleeding   Discharge Instructions You were cared for by a hospitalist during your hospital stay. If you have any questions about your discharge medications or the care you received  while you were in the hospital after you are discharged, you can call the unit and asked to speak with the hospitalist on call if the hospitalist that took care of you is not available. Once you are discharged,  your primary care physician will handle any further medical issues. Please note that NO REFILLS for any discharge medications will be authorized once you are discharged, as it is imperative that you return to your primary care physician (or establish a relationship with a primary care physician if you do not have one) for your aftercare needs so that they can reassess your need for medications and monitor your lab values.  Discharge Instructions   Call MD for:  difficulty breathing, headache or visual disturbances    Complete by:  As directed      Call MD for:  extreme fatigue    Complete by:  As directed      Call MD for:  hives    Complete by:  As directed      Call MD for:  persistant dizziness or light-headedness    Complete by:  As directed      Call MD for:  persistant nausea and vomiting    Complete by:  As directed      Call MD for:  redness, tenderness, or signs of infection (pain, swelling, redness, odor or green/yellow discharge around incision site)    Complete by:  As directed      Call MD for:  severe uncontrolled pain    Complete by:  As directed      Call MD for:  temperature >100.4    Complete by:  As directed      Diet - low sodium heart healthy    Complete by:  As directed      Increase activity slowly    Complete by:  As directed           Current Discharge Medication List    START taking these medications   Details  clindamycin (CLEOCIN) 300 MG capsule Take 1 capsule (300 mg total) by mouth 3 (three) times daily. Qty: 21 capsule, Refills: 0    hydrocortisone 1 % ointment Apply 1 application topically 2 (two) times daily. Apply to affected area twice daily for 1 week Qty: 30 g, Refills: 0    oxyCODONE (OXY IR/ROXICODONE) 5 MG immediate release tablet Take 1 tablet (5 mg total) by mouth every 6 (six) hours as needed for moderate pain. Qty: 30 tablet, Refills: 0      CONTINUE these medications which have NOT CHANGED   Details  acetaminophen (TYLENOL) 325 MG  tablet Take 325-650 mg by mouth every 6 (six) hours as needed (pain).      STOP taking these medications     naproxen sodium (ANAPROX) 220 MG tablet        No Known Allergies Follow-up Information   Follow up with No PCP Per Patient.   Specialty:  General Practice      Follow up with Sebastian Ache, MD In 1 week.   Specialty:  Urology   Contact information:   [address] [place] [address] [phone]       Follow up with Atilano Ina, MD In 1 week.   Specialty:  General Surgery   Contact information:   [address] [place] [address] [phone]        The results of significant diagnostics from this hospitalization (including imaging, microbiology, ancillary and laboratory)  are listed below for reference.    Significant Diagnostic Studies: US Scrotum  08/24/2014   CLINICAL DATA:  Pain, swelling, rash  EXAM: SCROTAL ULTRASOUND  DOPPLER ULTRASOUND OF THE TESTICLES  TECHNIQUE: Complete ultrasound examination of the testicles, epididymis, and other scrotal structures was performed. Color and spectral Doppler ultrasound were also utilized to evaluate blood flow to the testicles.  COMPARISON:  None.  FINDINGS: Right testicle  Measurements: 5.1 x 3 x 3.1 cm. No mass or microlithiasis visualized.  Left testicle  Measurements: 4.2 x 2.6 x 3.3 cm. No mass or microlithiasis visualized.  Right epididymis:  Normal in size.  Small spermatocele.  Left epididymis: Enlarged left epididymis with a heterogeneous appearance and increased Doppler flow relative to the right. 2.1 cm left spermatocele.  Hydrocele:  Bilateral hydroceles, right greater than left.  Varicocele:  None visualized.  Pulsed Doppler interrogation of both testes demonstrates low resistance arterial and venous waveforms bilaterally.  Other: Scrotal wall thickening. Soft tissue edema involving the perineum.  IMPRESSION: 1. Enlarged heterogeneous left epididymis with increased Doppler flow concerning for  epididymitis. 2. Scrotal wall thickening and soft tissue edema extending into the perineum. 3. Bilateral hydroceles, right greater than left.   Electronically Signed   By: Elige Ko   On: 08/24/2014 23:22   Ct Abdomen Pelvis W Contrast  08/25/2014   CLINICAL DATA:  Genital and right thigh pain and swelling. Rash. Evaluate for abscess.  EXAM: CT ABDOMEN AND PELVIS WITH CONTRAST  TECHNIQUE: Multidetector CT imaging of the abdomen and pelvis was performed using the standard protocol following bolus administration of intravenous contrast.  CONTRAST:  OMNIPAQUE IOHEXOL 300 MG/ML  SOLN  COMPARISON:  Ultrasound testes 08/24/2014. CT abdomen and pelvis 01/24/2011.  FINDINGS: Lung bases are clear.  The liver, spleen, gallbladder, pancreas, adrenal glands, abdominal aorta, inferior vena cava, and retroperitoneal lymph nodes are unremarkable. Small low-attenuation lesion in the lower pole right kidney measuring 2.3 cm diameter consistent with cyst. No hydronephrosis in either kidney. Stomach and small bowel are not abnormally distended. Stool in the colon without distention or wall thickening. No free air or free fluid in the abdomen.  Pelvis: Appendix is normal. Prostate gland is not enlarged. Bladder wall is not thickened. No free or loculated pelvic fluid collections. No pelvic lymphadenopathy. Left inguinal hernia containing fat. Moderately prominent groin lymph nodes bilaterally are likely reactive. There is evidence of edema and skin thickening of the scrotum and right groin region consistent with cellulitis. There is a small soft tissue fluid collection in the right medial upper thigh measuring about 9 x 20 mm consistent with a small abscess. Suggestion of small loculated collection in the posterior inferior aspect of the scrotum towards the left and measuring 1.7 cm. Visualization is limited due to significant scrotal edema. Small hydroceles. Degenerative changes in the lumbar spine. No destructive bone  lesions appreciated.  IMPRESSION: Extensive scrotal edema and skin thickening with infiltration in the scrotal and right groin subcutaneous fat consistent with cellulitis. Small collections are demonstrated in the right medial thigh and left posterior inferior scrotal skin consistent with small abscesses.   Electronically Signed   By: Burman Nieves M.D.   On: 08/25/2014 03:33   [place] Art/ven Flow Abd Pelv Doppler  08/24/2014   CLINICAL DATA:  Pain, swelling, rash  EXAM: SCROTAL ULTRASOUND  DOPPLER ULTRASOUND OF THE TESTICLES  TECHNIQUE: Complete ultrasound examination of the testicles, epididymis, and other scrotal structures was performed. Color and spectral Doppler ultrasound were also utilized  to evaluate blood flow to the testicles.  COMPARISON:  None.  FINDINGS: Right testicle  Measurements: 5.1 x 3 x 3.1 cm. No mass or microlithiasis visualized.  Left testicle  Measurements: 4.2 x 2.6 x 3.3 cm. No mass or microlithiasis visualized.  Right epididymis:  Normal in size.  Small spermatocele.  Left epididymis: Enlarged left epididymis with a heterogeneous appearance and increased Doppler flow relative to the right. 2.1 cm left spermatocele.  Hydrocele:  Bilateral hydroceles, right greater than left.  Varicocele:  None visualized.  Pulsed Doppler interrogation of both testes demonstrates low resistance arterial and venous waveforms bilaterally.  Other: Scrotal wall thickening. Soft tissue edema involving the perineum.  IMPRESSION: 1. Enlarged heterogeneous left epididymis with increased Doppler flow concerning for epididymitis. 2. Scrotal wall thickening and soft tissue edema extending into the perineum. 3. Bilateral hydroceles, right greater than left.   Electronically Signed   By: Elige Ko   On: 08/24/2014 23:22    Microbiology: Recent Results (from the past 240 hour(s))  SURGICAL PCR SCREEN     Status: Abnormal   Collection Time    08/25/14  9:04 AM      Result Value Ref Range Status   MRSA, PCR  POSITIVE (*) NEGATIVE Final   Comment: RESULT CALLED TO, READ BACK BY AND VERIFIED WITH:     C.RUSSELL AT 1238 ON 26SEP15 BY C.BONGEL   Staphylococcus aureus POSITIVE (*) NEGATIVE Final   Comment:            The Xpert SA Assay (FDA     approved for NASAL specimens     in patients over 58 years of age),     is one component of     a comprehensive surveillance     program.  Test performance has     been validated by [employer] for patients greater     than or equal to 63 year old.     It is not intended     to diagnose infection nor to     guide or monitor treatment.  ANAEROBIC CULTURE     Status: None   Collection Time    08/25/14  9:35 AM      Result Value Ref Range Status   Specimen Description GROIN RIGHT   Final   Special Requests     Final   Value: PATIENT ON FOLLOWING ZOSYN, CLINDAMYCIN AND VANCOMYCIN   Gram Stain PENDING   Incomplete   Culture     Final   Value: NO ANAEROBES ISOLATED; CULTURE IN PROGRESS FOR 5 DAYS     Performed at [facility]   Report Status PENDING   Incomplete  CULTURE, ROUTINE-ABSCESS     Status: None   Collection Time    08/25/14  9:35 AM      Result Value Ref Range Status   Specimen Description GROIN RIGHT   Final   Special Requests     Final   Value: PATIENT ON FOLLOWING ZOSYN, CLINDAMYCIN AND VANCOMYCIN   Gram Stain     Final   Value: ABUNDANT WBC PRESENT, PREDOMINANTLY PMN     RARE SQUAMOUS EPITHELIAL CELLS PRESENT     ABUNDANT GRAM POSITIVE COCCI IN PAIRS     IN CLUSTERS     Performed at [facility]   Culture     Final   Value: ABUNDANT METHICILLIN RESISTANT STAPHYLOCOCCUS AUREUS     Note: RIFAMPIN AND GENTAMICIN SHOULD NOT BE USED  AS SINGLE DRUGS FOR TREATMENT OF STAPH INFECTIONS. This organism DOES NOT demonstrate inducible Clindamycin resistance in vitro. CRITICAL RESULT CALLED TO, READ BACK BY AND VERIFIED WITH: GWEN H @ 9:25AM      08/27/14 BY DWEEKS     Performed at [facility]   Report Status  08/27/2014 FINAL   Final   Organism ID, Bacteria METHICILLIN RESISTANT STAPHYLOCOCCUS AUREUS   Final  ANAEROBIC CULTURE     Status: None   Collection Time    08/25/14 10:08 AM      Result Value Ref Range Status   Specimen Description SCROTUM SCROTAL ABSCESS   Final   Special Requests     Final   Value: PATIENT ON FOLLOWING ZOSYN, CLINDAMYCIN AND VANCOMYCIN   Gram Stain PENDING   Incomplete   Culture     Final   Value: NO ANAEROBES ISOLATED; CULTURE IN PROGRESS FOR 5 DAYS     Performed at [facility]   Report Status PENDING   Incomplete  CULTURE, ROUTINE-ABSCESS     Status: None   Collection Time    08/25/14 10:08 AM      Result Value Ref Range Status   Specimen Description SCROTUM SCROTAL ABSCESS   Final   Special Requests     Final   Value: PATIENT ON FOLLOWING ZOSYN, CLINDAMYCIN AND VANCOMYCIN   Gram Stain     Final   Value: ABUNDANT WBC PRESENT, PREDOMINANTLY PMN     NO SQUAMOUS EPITHELIAL CELLS SEEN     MODERATE GRAM POSITIVE COCCI IN PAIRS     IN CLUSTERS     Performed at [facility]   Culture     Final   Value: ABUNDANT METHICILLIN RESISTANT STAPHYLOCOCCUS AUREUS     Note: RIFAMPIN AND GENTAMICIN SHOULD NOT BE USED AS SINGLE DRUGS FOR TREATMENT OF STAPH INFECTIONS. This organism DOES NOT demonstrate inducible Clindamycin resistance in vitro. CRITICAL RESULT CALLED TO, READ BACK BY AND VERIFIED WITH: GWEN H @ 9:25AM      08/27/14 BY DWEEKS     Performed at [facility]   Report Status 08/27/2014 FINAL   Final   Organism ID, Bacteria METHICILLIN RESISTANT STAPHYLOCOCCUS AUREUS   Final     Labs: Basic Metabolic Panel:  Recent Labs Lab 08/24/14 2133 08/25/14 0536 08/27/14 0437  NA 137 139 142  K 3.8 3.8 4.4  CL 98 102 105  CO2 22 25 25   GLUCOSE 93 135* 101*  BUN 13 11 13   CREATININE 0.69 0.77 0.81  CALCIUM 9.0 8.4 9.0   Liver Function Tests:  Recent Labs Lab 08/24/14 2133 08/25/14 0536  AST 20 26  ALT 26 29  ALKPHOS 96 116   BILITOT 0.8 0.7  PROT 7.6 6.8  ALBUMIN 3.8 3.2*   No results found for this basename: LIPASE, AMYLASE,  in the last 168 hours No results found for this basename: AMMONIA,  in the last 168 hours CBC:  Recent Labs Lab 08/24/14 2132 08/25/14 0536 08/27/14 0437  WBC 17.5* 14.8* 8.9  HGB 13.2 12.4* 12.7*  HCT 40.3 37.4* 39.0  MCV 86.5 85.4 87.8  PLT 196 205 223   Cardiac Enzymes: No results found for this basename: CKTOTAL, CKMB, CKMBINDEX, TROPONINI,  in the last 168 hours BNP: BNP (last 3 results) No results found for this basename: PROBNP,  in the last 8760 hours CBG:  Recent Labs Lab 08/24/14 2112 08/25/14 0732 08/26/14 0551 08/27/14 0716  GLUCAP 89 132* 151* 128*  SignedJeralyn Bennett  [facility] 08/27/2014, 11:19 AM

## 2014-08-30 LAB — ANAEROBIC CULTURE

## 2015-07-02 IMAGING — CT CT ABD-PELV W/ CM
2 of 5 series · 16 of 46 positions shown, 18 images · IV contrast (OMNIPAQUE)
Comparison: Ultrasound testes 08/24/2014. CT abdomen and pelvis
01/24/2011.

CLINICAL DATA: Genital and right thigh pain and swelling. Rash.
Evaluate for abscess.

EXAM:
CT ABDOMEN AND PELVIS WITH CONTRAST
TECHNIQUE: Multidetector CT imaging of the abdomen and pelvis was performed
using the standard protocol following bolus administration of
intravenous contrast.
CONTRAST:  100mL OMNIPAQUE IOHEXOL 300 MG/ML  SOLN

[Series 2: rtn a/p with · axial · 0.87mm/px · z∈[-636,-86]mm · 13 of 124 slices shown, 15 images]
[im 7/124  soft-tissue]
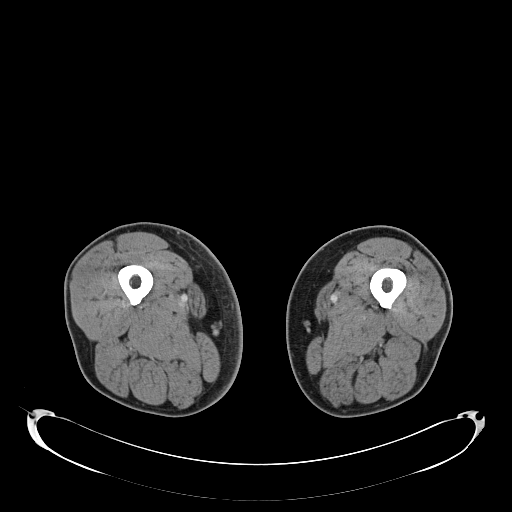
[im 7/124  bone]
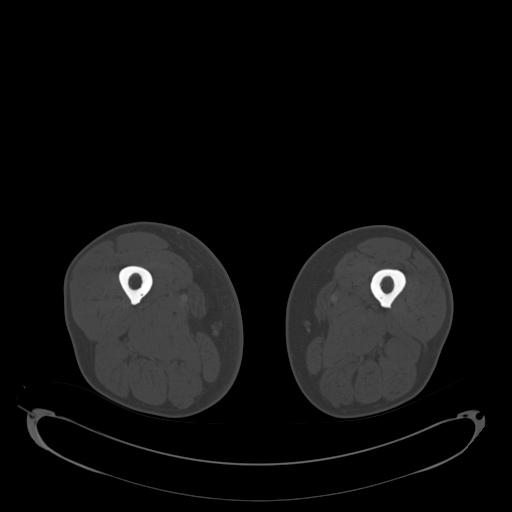
[im 19/124  soft-tissue]
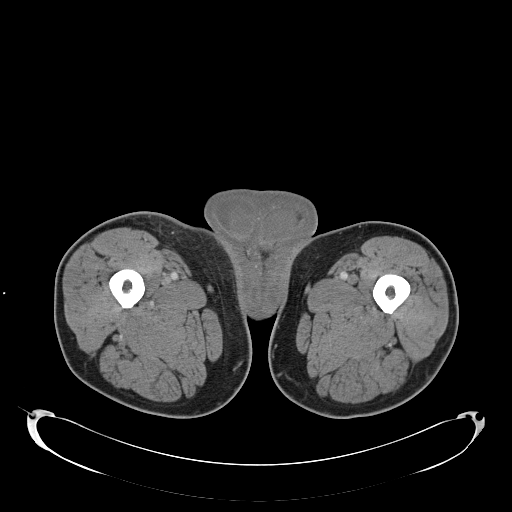
[im 25/124  soft-tissue]
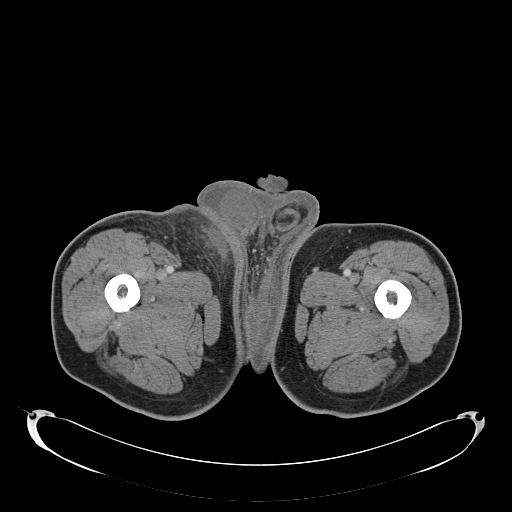
[im 37/124  soft-tissue]
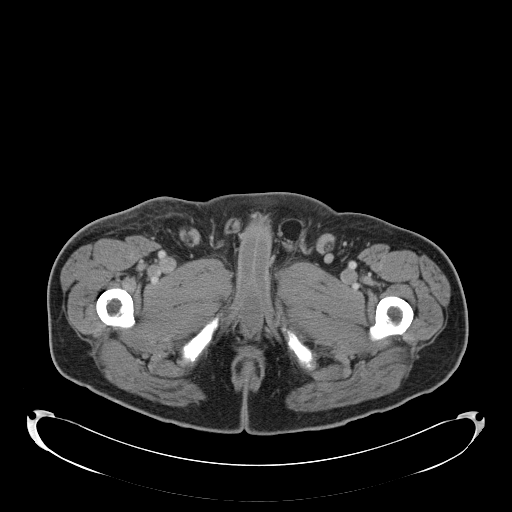
[im 44/124  soft-tissue]
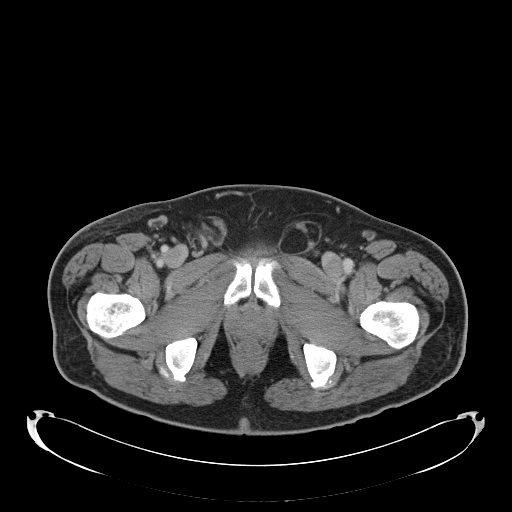
[im 56/124  soft-tissue]
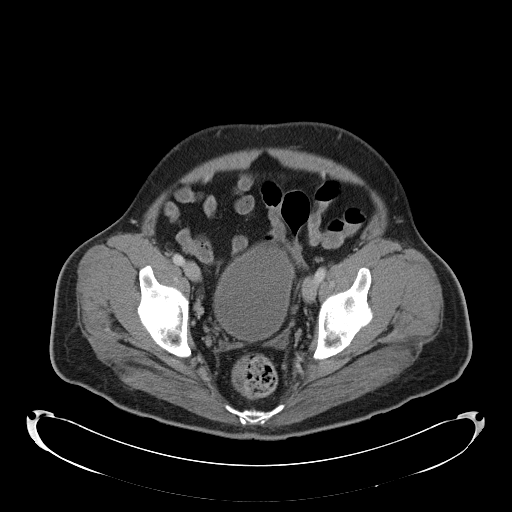
[im 62/124  soft-tissue]
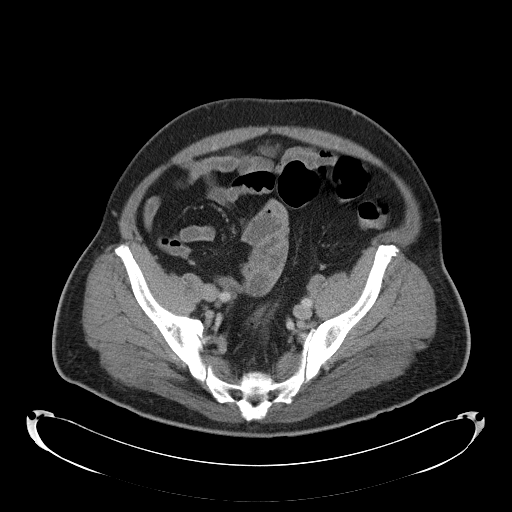
[im 68/124  soft-tissue]
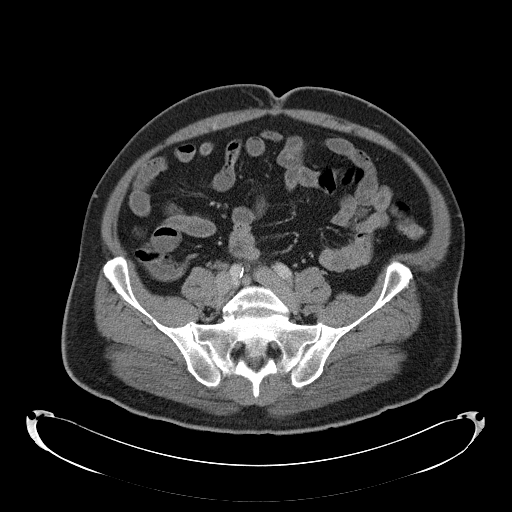
[im 80/124  soft-tissue]
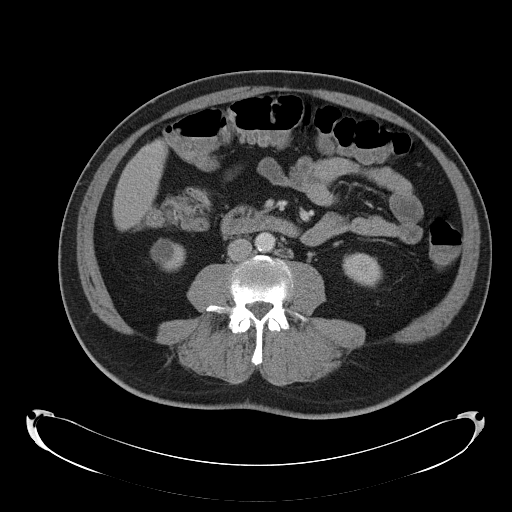
[im 80/124  bone]
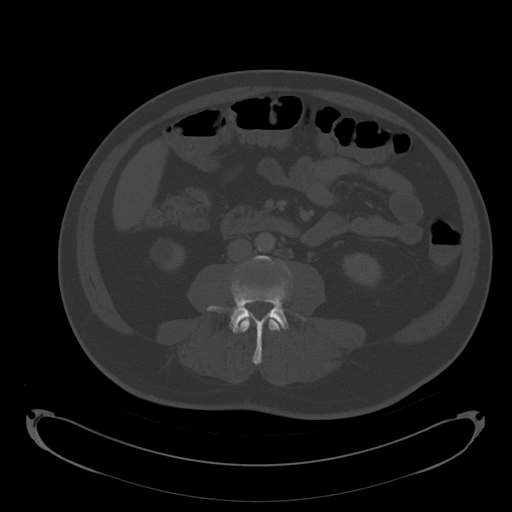
[im 87/124  soft-tissue]
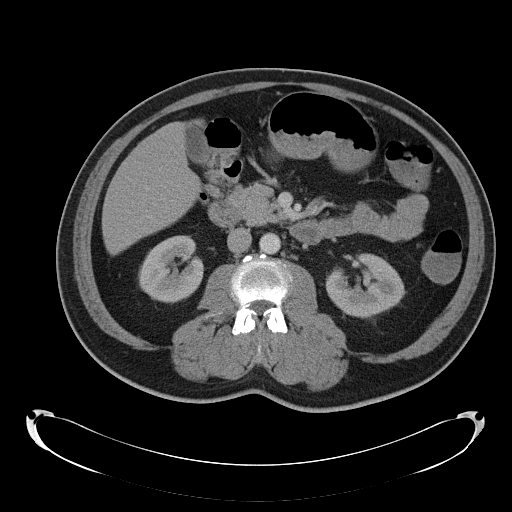
[im 99/124  soft-tissue]
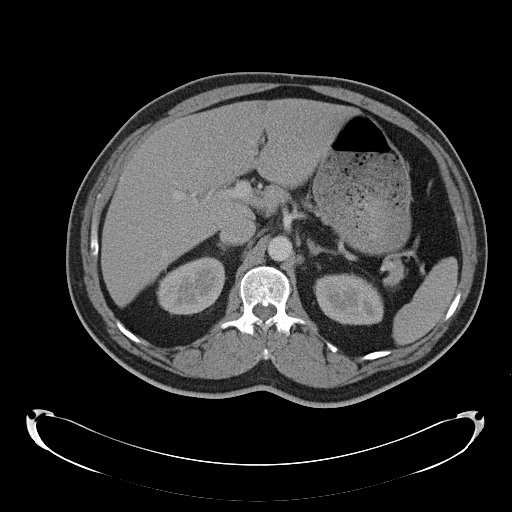
[im 105/124  soft-tissue]
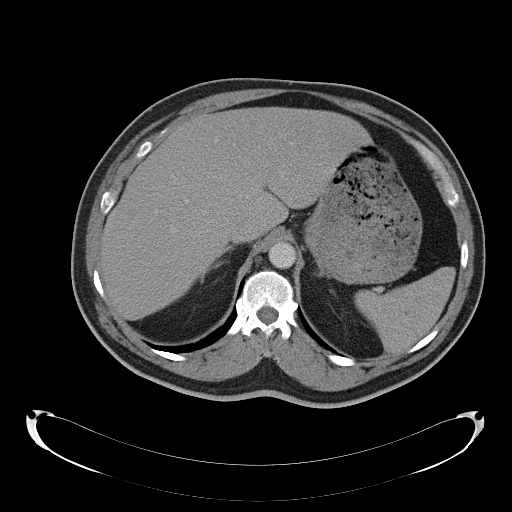
[im 117/124  soft-tissue]
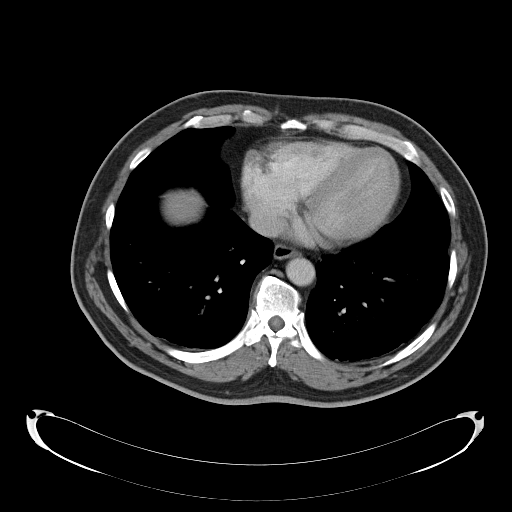

[Series 5: coronal · coronal · 1.24mm/px · 3 of 161 slices shown]
[im 54/161  soft-tissue]
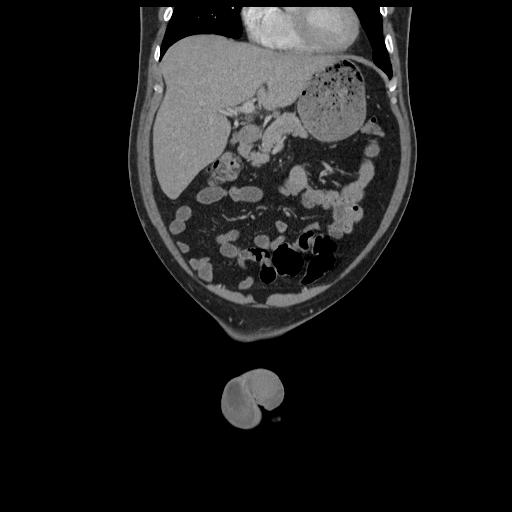
[im 72/161  soft-tissue]
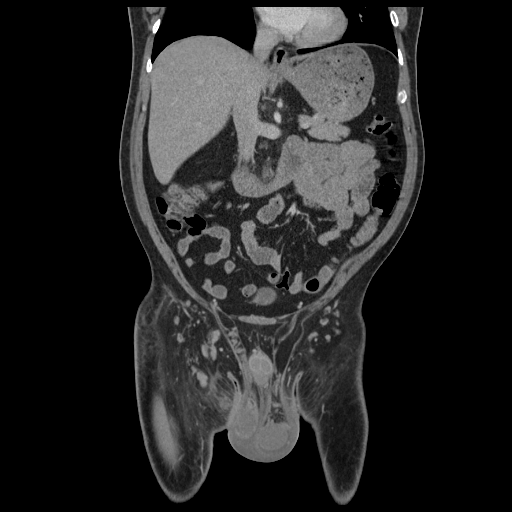
[im 89/161  soft-tissue]
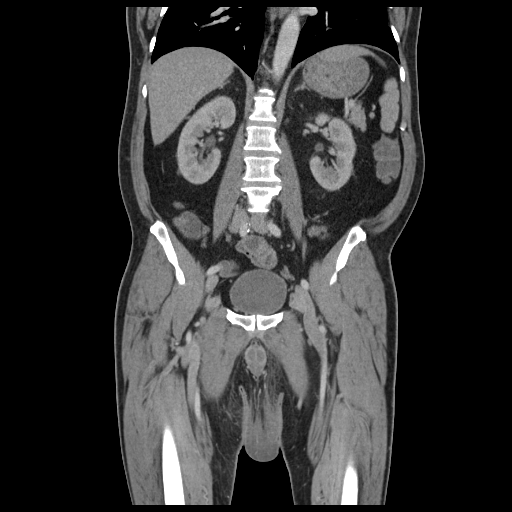

[16 of 46 positions shown; findings below may reference images not displayed]

FINDINGS: Lung bases are clear.

The liver, spleen, gallbladder, pancreas, adrenal glands, abdominal
aorta, inferior vena cava, and retroperitoneal lymph nodes are
unremarkable. Small low-attenuation lesion in the lower pole right
kidney measuring 2.3 cm diameter consistent with cyst. No
hydronephrosis in either kidney. Stomach and small bowel are not
abnormally distended. Stool in the colon without distention or wall
thickening. No free air or free fluid in the abdomen.

Pelvis: Appendix is normal. Prostate gland is not enlarged. Bladder
wall is not thickened. No free or loculated pelvic fluid
collections. No pelvic lymphadenopathy. Left inguinal hernia
containing fat. Moderately prominent groin lymph nodes bilaterally
are likely reactive. There is evidence of edema and skin thickening
of the scrotum and right groin region consistent with cellulitis.
There is a small soft tissue fluid collection in the right medial
upper thigh measuring about 9 x 20 mm consistent with a small
abscess. Suggestion of small loculated collection in the posterior
inferior aspect of the scrotum towards the left and measuring
cm. Visualization is limited due to significant scrotal edema. Small
hydroceles. Degenerative changes in the lumbar spine. No destructive
bone lesions appreciated.
IMPRESSION: Extensive scrotal edema and skin thickening with infiltration in the
scrotal and right groin subcutaneous fat consistent with cellulitis.
Small collections are demonstrated in the right medial thigh and
left posterior inferior scrotal skin consistent with small
abscesses.

## 2019-07-14 ENCOUNTER — Other Ambulatory Visit: Payer: Self-pay

## 2019-07-14 DIAGNOSIS — Z20822 Contact with and (suspected) exposure to covid-19: Secondary | ICD-10-CM

## 2019-07-15 LAB — NOVEL CORONAVIRUS, NAA: SARS-CoV-2, NAA: NOT DETECTED

## 2022-07-10 ENCOUNTER — Ambulatory Visit
Admission: EM | Admit: 2022-07-10 | Discharge: 2022-07-10 | Disposition: A | Discharge status: Home / Self Care | Payer: Self-pay

## 2022-07-10 NOTE — ED Provider Notes (Signed)
Patient advised that x-rays are indicated.  Patient inquired what would be done if the x-ray demonstrates abnormal findings.  Patient advised that either his PCP or orthopedics would need to be consulted management of osteoarthritis.  Patient verbalized preference to go to orthopedics for both the x-ray and treatment.  Patient politely declined x-ray order from [place] here at urgent care at this time.  No billable services were provided for patient today.      Theadora Rama Scales, PA-C 07/10/22 [id number]
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
